# Patient Record
Sex: Female | Born: 1965 | Race: White | Hispanic: No | Smoking: Former smoker
Health system: Southern US, Community
[De-identification: ages and names within clinical notes are randomized; demographics above are authoritative.]

## PROBLEM LIST (undated history)

## (undated) DIAGNOSIS — K635 Polyp of colon: Secondary | ICD-10-CM

## (undated) DIAGNOSIS — Z973 Presence of spectacles and contact lenses: Secondary | ICD-10-CM

## (undated) DIAGNOSIS — F419 Anxiety disorder, unspecified: Secondary | ICD-10-CM

## (undated) DIAGNOSIS — M8580 Other specified disorders of bone density and structure, unspecified site: Secondary | ICD-10-CM

## (undated) DIAGNOSIS — Z972 Presence of dental prosthetic device (complete) (partial): Secondary | ICD-10-CM

## (undated) DIAGNOSIS — R7303 Prediabetes: Secondary | ICD-10-CM

## (undated) DIAGNOSIS — R0789 Other chest pain: Secondary | ICD-10-CM

## (undated) HISTORY — PX: EYE SURGERY: SHX253

## (undated) HISTORY — PX: TUBAL LIGATION: SHX77

## (undated) HISTORY — DX: Other specified disorders of bone density and structure, unspecified site: M85.80

## (undated) HISTORY — PX: ABDOMINAL HYSTERECTOMY: SHX81

## (undated) HISTORY — PX: CHOLECYSTECTOMY: SHX55

---
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
## 1998-03-10 HISTORY — PX: CHOLECYSTECTOMY: SHX55

## 2006-03-10 HISTORY — PX: ABDOMINAL HYSTERECTOMY: SHX81

## 2006-03-22 ENCOUNTER — Emergency Department: Payer: Self-pay | Admitting: Emergency Medicine

## 2006-03-22 ENCOUNTER — Other Ambulatory Visit: Payer: Self-pay

## 2007-04-07 ENCOUNTER — Ambulatory Visit: Payer: Self-pay | Admitting: Family Medicine

## 2007-07-13 ENCOUNTER — Emergency Department: Payer: Self-pay | Admitting: Emergency Medicine

## 2007-08-15 ENCOUNTER — Emergency Department: Payer: Self-pay | Admitting: Emergency Medicine

## 2008-07-06 ENCOUNTER — Ambulatory Visit: Payer: Self-pay | Admitting: Internal Medicine

## 2008-07-18 ENCOUNTER — Ambulatory Visit: Payer: Self-pay | Admitting: Internal Medicine

## 2008-07-20 ENCOUNTER — Ambulatory Visit: Payer: Self-pay | Admitting: Unknown Physician Specialty

## 2008-07-27 ENCOUNTER — Ambulatory Visit: Payer: Self-pay | Admitting: Obstetrics and Gynecology

## 2008-08-03 ENCOUNTER — Inpatient Hospital Stay: Payer: Self-pay | Admitting: Obstetrics & Gynecology

## 2009-02-02 ENCOUNTER — Emergency Department: Payer: Self-pay | Admitting: Emergency Medicine

## 2010-06-02 ENCOUNTER — Emergency Department: Payer: Self-pay | Admitting: Emergency Medicine

## 2010-12-08 ENCOUNTER — Inpatient Hospital Stay: Payer: Self-pay | Admitting: Internal Medicine

## 2011-01-07 ENCOUNTER — Emergency Department: Payer: Self-pay | Admitting: Emergency Medicine

## 2013-11-21 ENCOUNTER — Emergency Department: Payer: Self-pay | Admitting: Emergency Medicine

## 2013-11-21 LAB — URINALYSIS, COMPLETE
Bilirubin,UR: NEGATIVE
Glucose,UR: NEGATIVE mg/dL (ref 0–75)
KETONE: NEGATIVE
Leukocyte Esterase: NEGATIVE
Nitrite: NEGATIVE
Ph: 6 (ref 4.5–8.0)
Protein: NEGATIVE
RBC,UR: 13 /HPF (ref 0–5)
Specific Gravity: 1.018 (ref 1.003–1.030)
Squamous Epithelial: 9
WBC UR: 2 /HPF (ref 0–5)

## 2013-11-21 LAB — CBC WITH DIFFERENTIAL/PLATELET
Basophil #: 0.1 10*3/uL (ref 0.0–0.1)
Basophil %: 0.7 %
EOS ABS: 0.3 10*3/uL (ref 0.0–0.7)
EOS PCT: 2.5 %
HCT: 42.2 % (ref 35.0–47.0)
HGB: 14.3 g/dL (ref 12.0–16.0)
LYMPHS PCT: 26.1 %
Lymphocyte #: 3.1 10*3/uL (ref 1.0–3.6)
MCH: 30.9 pg (ref 26.0–34.0)
MCHC: 33.9 g/dL (ref 32.0–36.0)
MCV: 91 fL (ref 80–100)
MONOS PCT: 6.5 %
Monocyte #: 0.8 x10 3/mm (ref 0.2–0.9)
NEUTROS ABS: 7.6 10*3/uL — AB (ref 1.4–6.5)
Neutrophil %: 64.2 %
PLATELETS: 252 10*3/uL (ref 150–440)
RBC: 4.63 10*6/uL (ref 3.80–5.20)
RDW: 13.9 % (ref 11.5–14.5)
WBC: 11.8 10*3/uL — ABNORMAL HIGH (ref 3.6–11.0)

## 2013-11-21 LAB — COMPREHENSIVE METABOLIC PANEL
ALBUMIN: 3.7 g/dL (ref 3.4–5.0)
ANION GAP: 5 — AB (ref 7–16)
Alkaline Phosphatase: 65 U/L
BUN: 10 mg/dL (ref 7–18)
Bilirubin,Total: 0.3 mg/dL (ref 0.2–1.0)
CHLORIDE: 109 mmol/L — AB (ref 98–107)
CO2: 26 mmol/L (ref 21–32)
CREATININE: 1 mg/dL (ref 0.60–1.30)
Calcium, Total: 8.1 mg/dL — ABNORMAL LOW (ref 8.5–10.1)
Glucose: 131 mg/dL — ABNORMAL HIGH (ref 65–99)
OSMOLALITY: 280 (ref 275–301)
Potassium: 3.5 mmol/L (ref 3.5–5.1)
SGOT(AST): 19 U/L (ref 15–37)
SGPT (ALT): 24 U/L
Sodium: 140 mmol/L (ref 136–145)
Total Protein: 7.3 g/dL (ref 6.4–8.2)

## 2013-11-21 LAB — LIPASE, BLOOD: Lipase: 137 U/L (ref 73–393)

## 2014-02-21 ENCOUNTER — Emergency Department: Payer: Self-pay | Admitting: Emergency Medicine

## 2014-02-21 LAB — CBC WITH DIFFERENTIAL/PLATELET
BASOS ABS: 0.1 10*3/uL (ref 0.0–0.1)
Basophil %: 0.6 %
Eosinophil #: 0.2 10*3/uL (ref 0.0–0.7)
Eosinophil %: 2.1 %
HCT: 43.7 % (ref 35.0–47.0)
HGB: 14.6 g/dL (ref 12.0–16.0)
LYMPHS ABS: 2.2 10*3/uL (ref 1.0–3.6)
LYMPHS PCT: 22.9 %
MCH: 30.3 pg (ref 26.0–34.0)
MCHC: 33.3 g/dL (ref 32.0–36.0)
MCV: 91 fL (ref 80–100)
Monocyte #: 0.6 x10 3/mm (ref 0.2–0.9)
Monocyte %: 6.3 %
NEUTROS ABS: 6.4 10*3/uL (ref 1.4–6.5)
NEUTROS PCT: 68.1 %
PLATELETS: 215 10*3/uL (ref 150–440)
RBC: 4.8 10*6/uL (ref 3.80–5.20)
RDW: 13.1 % (ref 11.5–14.5)
WBC: 9.4 10*3/uL (ref 3.6–11.0)

## 2014-02-21 LAB — BASIC METABOLIC PANEL
Anion Gap: 8 (ref 7–16)
BUN: 10 mg/dL (ref 7–18)
CHLORIDE: 108 mmol/L — AB (ref 98–107)
CREATININE: 0.99 mg/dL (ref 0.60–1.30)
Calcium, Total: 8.9 mg/dL (ref 8.5–10.1)
Co2: 23 mmol/L (ref 21–32)
EGFR (Non-African Amer.): >60
GLUCOSE: 106 mg/dL — AB (ref 65–99)
Osmolality: 277 (ref 275–301)
POTASSIUM: 3.7 mmol/L (ref 3.5–5.1)
Sodium: 139 mmol/L (ref 136–145)

## 2014-02-21 LAB — URINALYSIS, COMPLETE
BACTERIA: NONE SEEN
Bilirubin,UR: NEGATIVE
Glucose,UR: NEGATIVE mg/dL (ref 0–75)
Ketone: NEGATIVE
Leukocyte Esterase: NEGATIVE
NITRITE: NEGATIVE
PROTEIN: NEGATIVE
Ph: 6 (ref 4.5–8.0)
Specific Gravity: 1.003 (ref 1.003–1.030)
Squamous Epithelial: 1
WBC UR: NONE SEEN /HPF (ref 0–5)

## 2014-02-21 LAB — TROPONIN I: Troponin-I: <0.02 ng/mL

## 2014-04-10 DIAGNOSIS — D242 Benign neoplasm of left breast: Secondary | ICD-10-CM

## 2014-04-10 HISTORY — DX: Benign neoplasm of left breast: D24.2

## 2014-05-04 HISTORY — PX: BREAST EXCISIONAL BIOPSY: SUR124

## 2014-05-12 DIAGNOSIS — D242 Benign neoplasm of left breast: Secondary | ICD-10-CM | POA: Insufficient documentation

## 2014-11-20 DIAGNOSIS — D249 Benign neoplasm of unspecified breast: Secondary | ICD-10-CM | POA: Insufficient documentation

## 2014-12-15 ENCOUNTER — Emergency Department
Admission: EM | Admit: 2014-12-15 | Discharge: 2014-12-15 | Disposition: A | Discharge status: Home / Self Care | Payer: Managed Care, Other (non HMO) | Attending: Emergency Medicine | Admitting: Emergency Medicine

## 2014-12-15 ENCOUNTER — Emergency Department: Payer: Managed Care, Other (non HMO)

## 2014-12-15 ENCOUNTER — Encounter: Payer: Self-pay | Admitting: Emergency Medicine

## 2014-12-15 DIAGNOSIS — R109 Unspecified abdominal pain: Secondary | ICD-10-CM

## 2014-12-15 DIAGNOSIS — R319 Hematuria, unspecified: Secondary | ICD-10-CM

## 2014-12-15 DIAGNOSIS — N390 Urinary tract infection, site not specified: Secondary | ICD-10-CM | POA: Insufficient documentation

## 2014-12-15 DIAGNOSIS — Z720 Tobacco use: Secondary | ICD-10-CM | POA: Diagnosis not present

## 2014-12-15 DIAGNOSIS — R1031 Right lower quadrant pain: Secondary | ICD-10-CM | POA: Diagnosis present

## 2014-12-15 LAB — URINALYSIS COMPLETE WITH MICROSCOPIC (ARMC ONLY)
Bilirubin Urine: NEGATIVE
GLUCOSE, UA: NEGATIVE mg/dL
KETONES UR: NEGATIVE mg/dL
Leukocytes, UA: NEGATIVE
NITRITE: NEGATIVE
Protein, ur: NEGATIVE mg/dL
SPECIFIC GRAVITY, URINE: 1.006 (ref 1.005–1.030)
pH: 7 (ref 5.0–8.0)

## 2014-12-15 LAB — COMPREHENSIVE METABOLIC PANEL
ALBUMIN: 4.4 g/dL (ref 3.5–5.0)
ALK PHOS: 55 U/L (ref 38–126)
ALT: 17 U/L (ref 14–54)
AST: 25 U/L (ref 15–41)
Anion gap: 9 (ref 5–15)
BILIRUBIN TOTAL: 0.5 mg/dL (ref 0.3–1.2)
BUN: 9 mg/dL (ref 6–20)
CALCIUM: 9.4 mg/dL (ref 8.9–10.3)
CO2: 21 mmol/L — ABNORMAL LOW (ref 22–32)
Chloride: 109 mmol/L (ref 101–111)
Creatinine, Ser: 0.88 mg/dL (ref 0.44–1.00)
GFR calc Af Amer: >60 mL/min (ref >60)
GFR calc non Af Amer: >60 mL/min (ref >60)
GLUCOSE: 101 mg/dL — AB (ref 65–99)
Potassium: 4.1 mmol/L (ref 3.5–5.1)
Sodium: 139 mmol/L (ref 135–145)
TOTAL PROTEIN: 7.5 g/dL (ref 6.5–8.1)

## 2014-12-15 LAB — CBC
HCT: 42.2 % (ref 35.0–47.0)
Hemoglobin: 14.9 g/dL (ref 12.0–16.0)
MCH: 30.9 pg (ref 26.0–34.0)
MCHC: 35.2 g/dL (ref 32.0–36.0)
MCV: 87.7 fL (ref 80.0–100.0)
Platelets: 229 10*3/uL (ref 150–440)
RBC: 4.81 MIL/uL (ref 3.80–5.20)
RDW: 13.7 % (ref 11.5–14.5)
WBC: 12.4 10*3/uL — ABNORMAL HIGH (ref 3.6–11.0)

## 2014-12-15 LAB — LIPASE, BLOOD: Lipase: 29 U/L (ref 22–51)

## 2014-12-15 MED ORDER — FENTANYL CITRATE (PF) 100 MCG/2ML IJ SOLN
75.0000 ug | Freq: Once | INTRAMUSCULAR | Status: AC
  Administered 2014-12-15: 75 ug via INTRAVENOUS

## 2014-12-15 MED ORDER — IOHEXOL 240 MG/ML SOLN
25.0000 mL | Freq: Once | INTRAMUSCULAR | Status: AC | PRN
  Administered 2014-12-15: 25 mL via ORAL

## 2014-12-15 MED ORDER — ONDANSETRON HCL 4 MG/2ML IJ SOLN
INTRAMUSCULAR | Status: AC
  Administered 2014-12-15: 4 mg via INTRAVENOUS
  Filled 2014-12-15: qty 2

## 2014-12-15 MED ORDER — KETOROLAC TROMETHAMINE 30 MG/ML IJ SOLN: 30.0000 mg | Freq: Once | INTRAMUSCULAR | Status: DC

## 2014-12-15 MED ORDER — SODIUM CHLORIDE 0.9 % IV BOLUS (SEPSIS)
1000.0000 mL | Freq: Once | INTRAVENOUS | Status: AC
  Administered 2014-12-15: 1000 mL via INTRAVENOUS

## 2014-12-15 MED ORDER — CEPHALEXIN 500 MG PO CAPS: 500.0000 mg | ORAL_CAPSULE | Freq: Four times a day (QID) | ORAL | Status: DC

## 2014-12-15 MED ORDER — ONDANSETRON 4 MG PO TBDP: 4.0000 mg | ORAL_TABLET | Freq: Four times a day (QID) | ORAL | Status: DC | PRN

## 2014-12-15 MED ORDER — FENTANYL CITRATE (PF) 100 MCG/2ML IJ SOLN
INTRAMUSCULAR | Status: AC
  Administered 2014-12-15: 75 ug via INTRAVENOUS
  Filled 2014-12-15: qty 2

## 2014-12-15 MED ORDER — IOHEXOL 300 MG/ML  SOLN
100.0000 mL | Freq: Once | INTRAMUSCULAR | Status: AC | PRN
  Administered 2014-12-15: 100 mL via INTRAVENOUS

## 2014-12-15 MED ORDER — HYDROCODONE-ACETAMINOPHEN 5-325 MG PO TABS: 1.0000 | ORAL_TABLET | Freq: Four times a day (QID) | ORAL | Status: DC | PRN

## 2014-12-15 MED ORDER — ONDANSETRON HCL 4 MG/2ML IJ SOLN
4.0000 mg | Freq: Once | INTRAMUSCULAR | Status: AC
  Administered 2014-12-15: 4 mg via INTRAVENOUS

## 2014-12-15 NOTE — ED Notes (Signed)
Pt to ed with c/o right lower intermittent abd pain acute onset last night +nausea.  Pt denies urinary problems or pain.

## 2014-12-15 NOTE — ED Notes (Signed)
Patient transported to CT 

## 2014-12-15 NOTE — ED Notes (Signed)
Pt states abd pain at her belly button last night, increasing in pain now radiates to her right abd, pt states pain comes and goes, states nausea

## 2014-12-15 NOTE — Discharge Instructions (Signed)
Abdominal Pain, Adult  Please follow-up with your doctor this week. I suspect he may have a small kidney stone or slight urinary tract infection causing your bladder to spasm. However, should you develop a fever, vomiting, severe pain, black or bloody stool, or other new concerns arise please return to the emergency room.  Many things can cause abdominal pain. Usually, abdominal pain is not caused by a disease and will improve without treatment. It can often be observed and treated at home. Your health care provider will do a physical exam and possibly order blood tests and X-rays to help determine the seriousness of your pain. However, in many cases, more time must pass before a clear cause of the pain can be found. Before that point, your health care provider may not know if you need more testing or further treatment. HOME CARE INSTRUCTIONS Monitor your abdominal pain for any changes. The following actions may help to alleviate any discomfort you are experiencing:  Only take over-the-counter or prescription medicines as directed by your health care provider.  Do not take laxatives unless directed to do so by your health care provider.  Try a clear liquid diet (broth, tea, or water) as directed by your health care provider. Slowly move to a bland diet as tolerated. SEEK MEDICAL CARE IF:  You have unexplained abdominal pain.  You have abdominal pain associated with nausea or diarrhea.  You have pain when you urinate or have a bowel movement.  You experience abdominal pain that wakes you in the night.  You have abdominal pain that is worsened or improved by eating food.  You have abdominal pain that is worsened with eating fatty foods.  You have a fever. SEEK IMMEDIATE MEDICAL CARE IF:  Your pain does not go away within 2 hours.  You keep throwing up (vomiting).  Your pain is felt only in portions of the abdomen, such as the right side or the left lower portion of the abdomen.  You  pass bloody or black tarry stools. MAKE SURE YOU:  Understand these instructions.  Will watch your condition.  Will get help right away if you are not doing well or get worse.   This information is not intended to replace advice given to you by your health care provider. Make sure you discuss any questions you have with your health care provider.   Document Released: 12/04/2004 Document Revised: 11/15/2014 Document Reviewed: 11/03/2012 Elsevier Interactive Patient Education [employer].

## 2014-12-15 NOTE — ED Notes (Signed)
Dr. Quale at bedside.  

## 2014-12-15 NOTE — ED Provider Notes (Addendum)
[facility] Emergency Department Provider Note REMINDER - THIS NOTE IS NOT A FINAL MEDICAL RECORD UNTIL IT IS SIGNED. UNTIL THEN, THE CONTENT BELOW MAY REFLECT INFORMATION FROM A DOCUMENTATION TEMPLATE, NOT THE ACTUAL PATIENT VISIT. ____________________________________________  Time seen: Approximately 12:02 PM  I have reviewed the triage vital signs and the nursing notes.   HISTORY  Chief Complaint Abdominal Pain    HPI Nichole Hall is a 49 y.o. female reports a previous history of kidney stones, cholecystectomy, and hysterectomy.  Patient notes that she started having discomfort around her bellybutton last night, and she is now had increasing pain she describes is severe and cramping in the right lower abdomen. This is associated with nausea but no vomiting. No fevers or chills. No vaginal bleeding. No chest pain or trouble breathing.  She states she has a 10 out of 10 pain located in the right lower abdomen that shoots to the right back.   History reviewed. No pertinent past medical history.  There are no active problems to display for this patient.   History reviewed. No pertinent past surgical history.  Current Outpatient Rx  Name  Route  Sig  Dispense  Refill  . cephALEXin (KEFLEX) 500 MG capsule   Oral   Take 1 capsule (500 mg total) by mouth 4 (four) times daily.   28 capsule   0   . HYDROcodone-acetaminophen (NORCO/VICODIN) 5-325 MG tablet   Oral   Take 1 tablet by mouth every 6 (six) hours as needed for moderate pain.   15 tablet   0   . ondansetron (ZOFRAN ODT) 4 MG disintegrating tablet   Oral   Take 1 tablet (4 mg total) by mouth every 6 (six) hours as needed for nausea or vomiting.   20 tablet   0     Allergies Review of patient's allergies indicates no known allergies.  History reviewed. No pertinent family history.  Social History Social History  Substance Use Topics  . Smoking status: Current Every Day Smoker  .  Smokeless tobacco: None  . Alcohol Use: No    Review of Systems Constitutional: No fever/chills Eyes: No visual changes. ENT: No sore throat. Cardiovascular: Denies chest pain. Respiratory: Denies shortness of breath. Gastrointestinal: Passing gas normally. No diarrhea.  No constipation. Genitourinary: Negative for dysuria. No pain with urination. Musculoskeletal: Negative for back pain. Skin: Negative for rash. Neurological: Negative for headaches, focal weakness or numbness.  10-point ROS otherwise negative.  ____________________________________________   PHYSICAL EXAM:  VITAL SIGNS: ED Triage Vitals  Enc Vitals Group     BP 12/15/14 1047 124/79 mmHg     Pulse Rate 12/15/14 1047 80     Resp 12/15/14 1047 16     Temp 12/15/14 1047 98.2 F (36.8 C)     Temp Source 12/15/14 1047 Oral     SpO2 12/15/14 1047 99 %     Weight 12/15/14 1047 175 lb (79.379 kg)     Height 12/15/14 1047 5\' 9"  (1.753 m)     Head Cir --      Peak Flow --      Pain Score 12/15/14 1053 7     Pain Loc --      Pain Edu? --      Excl. in [place]? --    Constitutional: Alert and oriented. Well appearing except sitting up in the bed, holding her hand on her right lower abdomen in severe pain. Eyes: Conjunctivae are normal. PERRL. EOMI. Head: Atraumatic. Nose:  No congestion/rhinnorhea. Mouth/Throat: Mucous membranes are moist.  Oropharynx non-erythematous. Neck: No stridor.   Cardiovascular: Normal rate, regular rhythm. Grossly normal heart sounds.  Good peripheral circulation. Respiratory: Normal respiratory effort.  No retractions. Lungs CTAB. Gastrointestinal: Soft and nontender except for focal tenderness in the right lower quadrant without guarding, the patient does exhibit some mild rebound tenderness. No distention. No abdominal bruits. No CVA tenderness. Musculoskeletal: No lower extremity tenderness nor edema.  No joint effusions. Neurologic:  Normal speech and language. No gross focal neurologic  deficits are appreciated. No gait instability. Skin:  Skin is warm, dry and intact. No rash noted. Psychiatric: Mood and affect are normal. Speech and behavior are normal.  ____________________________________________   LABS (all labs ordered are listed, but only abnormal results are displayed)  Labs Reviewed  COMPREHENSIVE METABOLIC PANEL - Abnormal; Notable for the following:    CO2 21 (*)    Glucose, Bld 101 (*)    All other components within normal limits  CBC - Abnormal; Notable for the following:    WBC 12.4 (*)    All other components within normal limits  URINALYSIS COMPLETEWITH MICROSCOPIC ([facility] ONLY) - Abnormal; Notable for the following:    Color, Urine YELLOW (*)    APPearance HAZY (*)    Hgb urine dipstick 1+ (*)    Bacteria, UA MANY (*)    Squamous Epithelial / LPF 6-30 (*)    All other components within normal limits  LIPASE, BLOOD   ____________________________________________  EKG   ____________________________________________  RADIOLOGY  CT Abdomen Pelvis W Contrast (Final result) Result time: 12/15/14 13:36:23   Final result by Rad Results In Interface (12/15/14 13:36:23)   Narrative:   CLINICAL DATA: Right lower quadrant pain for 1 day  EXAM: CT ABDOMEN AND PELVIS WITH CONTRAST  TECHNIQUE: Multidetector CT imaging of the abdomen and pelvis was performed using the standard protocol following bolus administration of intravenous contrast.  CONTRAST: 118mL OMNIPAQUE IOHEXOL 300 MG/ML SOLN  COMPARISON: 11/21/2013  FINDINGS: Stable left lower lobe pulmonary nodule. Dependent atelectasis  Fatty liver  Postcholecystectomy  Spleen, pancreas, adrenal glands are within normal limits  Stable tiny hypodensity in the left kidney. Right kidney is unremarkable.  Normal appendix. No evidence of sigmoid diverticulitis.  2.7 cm cyst in the right ovary is smaller. New 3.6 cm hypodensity in the left ovary.  Small amount of free fluid.  Bladder is unremarkable. No abnormal retroperitoneal adenopathy.  IMPRESSION: Normal appendix  New 3.6 cm lesion in the left ovary. Followup ultrasound in 6 weeks is recommended to ensure resolution.     ____________________________________________   PROCEDURES  Procedure(s) performed: None  Critical Care performed: No  ____________________________________________   INITIAL IMPRESSION / ASSESSMENT AND PLAN / ED COURSE  Pertinent labs & imaging results that were available during my care of the patient were reviewed by me and considered in my medical decision making (see chart for details).  Patient presents with periumbilical pain that is out of the right lower quadrant. She appears to be in severe pain rated she does report a morphine drops her blood pressures, but is able to tolerate other medications. No history of allergy.  We'll give the patient medication for pain, based on the focality of her right lower quadrant pain I wished rule out acute appendicitis, other considerations would be ovarian process or abscess as the patient reports she does have a history of a previous infection after a hysterectomy 5 years ago. She is unsure if she still has  both ovaries. Kidney stone is also strongly considered. No cardiac or pulmonary symptoms.  We'll obtain abdominal labs comforted CT imaging. No cardiopulmonary symptoms.  ----------------------------------------- 3:33 PM on 12/15/2014 -----------------------------------------  Patient reports improvement. In review of her history and presentation, I would suspect this may be a slight urinary tract infection or she may be passing a small kidney stone which would explain her pain given her radius history. Her appendix is normal and there is no intra-abdominal abnormality noted on CT that would explain her discomfort. She does appear much improved.  I discuss careful return precautions and also advised that she should follow-up in  the next month with her OB/GYN physician regarding her lesion on the left ovary which she agrres to.  I will prescribe the patient a narcotic pain medicine due to their condition which I anticipate will cause at least moderate pain short term. I discussed with the patient safe use of narcotic pain medicines, and that they are not to drive, work in dangerous areas, or ever take more than prescribed (no more than 1 pill every 6 hours). We discussed that this is the type of medication that "Alfonse Spruce" may have overdosed on and the risks of this type of medicine. Patient is very agreeable to only use as prescribed and to never use more than prescribed. ____________________________________________   FINAL CLINICAL IMPRESSION(S) / ED DIAGNOSES  Final diagnoses:  Acute abdominal pain in right flank  Acute urinary tract infection  Hematuria      Delman Kitten, MD 12/15/14 [place], MD 12/15/14 1536

## 2015-01-27 ENCOUNTER — Encounter: Payer: Self-pay | Admitting: Emergency Medicine

## 2015-01-27 ENCOUNTER — Emergency Department: Payer: Managed Care, Other (non HMO)

## 2015-01-27 ENCOUNTER — Emergency Department
Admission: EM | Admit: 2015-01-27 | Discharge: 2015-01-27 | Discharge status: Against Medical Advice | Payer: Managed Care, Other (non HMO) | Attending: Emergency Medicine | Admitting: Emergency Medicine

## 2015-01-27 DIAGNOSIS — F17200 Nicotine dependence, unspecified, uncomplicated: Secondary | ICD-10-CM | POA: Insufficient documentation

## 2015-01-27 DIAGNOSIS — M549 Dorsalgia, unspecified: Secondary | ICD-10-CM | POA: Diagnosis not present

## 2015-01-27 DIAGNOSIS — R42 Dizziness and giddiness: Secondary | ICD-10-CM | POA: Insufficient documentation

## 2015-01-27 DIAGNOSIS — R0602 Shortness of breath: Secondary | ICD-10-CM | POA: Diagnosis not present

## 2015-01-27 DIAGNOSIS — R079 Chest pain, unspecified: Secondary | ICD-10-CM | POA: Diagnosis not present

## 2015-01-27 DIAGNOSIS — R110 Nausea: Secondary | ICD-10-CM | POA: Insufficient documentation

## 2015-01-27 DIAGNOSIS — Z792 Long term (current) use of antibiotics: Secondary | ICD-10-CM | POA: Insufficient documentation

## 2015-01-27 HISTORY — DX: Anxiety disorder, unspecified: F41.9

## 2015-01-27 LAB — BASIC METABOLIC PANEL
ANION GAP: 8 (ref 5–15)
BUN: 12 mg/dL (ref 6–20)
CALCIUM: 9.9 mg/dL (ref 8.9–10.3)
CO2: 29 mmol/L (ref 22–32)
Chloride: 104 mmol/L (ref 101–111)
Creatinine, Ser: 0.86 mg/dL (ref 0.44–1.00)
Glucose, Bld: 109 mg/dL — ABNORMAL HIGH (ref 65–99)
Potassium: 4.5 mmol/L (ref 3.5–5.1)
SODIUM: 141 mmol/L (ref 135–145)

## 2015-01-27 LAB — CBC
HCT: 45.7 % (ref 35.0–47.0)
Hemoglobin: 15.1 g/dL (ref 12.0–16.0)
MCH: 29.4 pg (ref 26.0–34.0)
MCHC: 33 g/dL (ref 32.0–36.0)
MCV: 89 fL (ref 80.0–100.0)
Platelets: 236 10*3/uL (ref 150–440)
RBC: 5.14 MIL/uL (ref 3.80–5.20)
RDW: 13.7 % (ref 11.5–14.5)
WBC: 9.1 10*3/uL (ref 3.6–11.0)

## 2015-01-27 LAB — TROPONIN I

## 2015-01-27 MED ORDER — MECLIZINE HCL 25 MG PO TABS
25.0000 mg | ORAL_TABLET | Freq: Once | ORAL | Status: DC
  Filled 2015-01-27: qty 1

## 2015-01-27 NOTE — ED Notes (Signed)
Pt not in room for medication

## 2015-01-27 NOTE — ED Provider Notes (Signed)
[facility] Emergency Department Provider Note    ____________________________________________  Time seen: 1400  I have reviewed the triage vital signs and the nursing notes.   HISTORY  Chief Complaint Chest Pain   History limited by: Not Limited   HPI Nichole Hall is a 49 y.o. female who presents to the emergency department today with complaint of not feeling well. The patient states she has a slightly hard time explaining exactly what that means. She does state however she has been feeling lightheaded she said chest pain that she has had back pain she has had some nausea. She states the symptoms have been going on for 4 days. They are intermittent. She denies doing anything different or unusual on Wednesday when they started. She denies any fevers with this. She states she did have similar symptoms number of years ago and thinks that she was told she had pleurisy.   Past Medical History  Diagnosis Date  . Anxiety     There are no active problems to display for this patient.   History reviewed. No pertinent past surgical history.  Current Outpatient Rx  Name  Route  Sig  Dispense  Refill  . cephALEXin (KEFLEX) 500 MG capsule   Oral   Take 1 capsule (500 mg total) by mouth 4 (four) times daily.   28 capsule   0   . HYDROcodone-acetaminophen (NORCO/VICODIN) 5-325 MG tablet   Oral   Take 1 tablet by mouth every 6 (six) hours as needed for moderate pain.   15 tablet   0   . ondansetron (ZOFRAN ODT) 4 MG disintegrating tablet   Oral   Take 1 tablet (4 mg total) by mouth every 6 (six) hours as needed for nausea or vomiting.   20 tablet   0     Allergies Morphine and related  History reviewed. No pertinent family history.  Social History Social History  Substance Use Topics  . Smoking status: Current Every Day Smoker  . Smokeless tobacco: None  . Alcohol Use: No    Review of Systems  Constitutional: Negative for  fever. Cardiovascular: Positive for chest pain. Respiratory: Positive for shortness of breath. Gastrointestinal: Negative for abdominal pain, vomiting and diarrhea. Genitourinary: Negative for dysuria. Musculoskeletal: Negative for back pain. Skin: Negative for rash. Neurological: Negative for headaches, focal weakness or numbness. 10-point ROS otherwise negative.  ____________________________________________   PHYSICAL EXAM:  VITAL SIGNS: ED Triage Vitals  Enc Vitals Group     BP 01/27/15 1037 105/80 mmHg     Pulse Rate 01/27/15 1037 76     Resp 01/27/15 1037 20     Temp 01/27/15 1037 98 F (36.7 C)     Temp Source 01/27/15 1037 Oral     SpO2 01/27/15 1037 100 %     Weight 01/27/15 1037 175 lb (79.379 kg)     Height --      Head Cir --      Peak Flow --      Pain Score 01/27/15 1034 6   Constitutional: Alert and oriented. Well appearing and in no distress. Eyes: Conjunctivae are normal. PERRL. Normal extraocular movements. ENT   Head: Normocephalic and atraumatic.   Nose: No congestion/rhinnorhea.   Mouth/Throat: Mucous membranes are moist.   Neck: No stridor. Hematological/Lymphatic/Immunilogical: No cervical lymphadenopathy. Cardiovascular: Normal rate, regular rhythm.  No murmurs, rubs, or gallops. Respiratory: Normal respiratory effort without tachypnea nor retractions. Breath sounds are clear and equal bilaterally. No wheezes/rales/rhonchi. Gastrointestinal: Soft and  nontender. No distention.  Genitourinary: Deferred Musculoskeletal: Normal range of motion in all extremities. No joint effusions.  No lower extremity tenderness nor edema. Neurologic:  Normal speech and language. No gross focal neurologic deficits are appreciated.  Skin:  Skin is warm, dry and intact. No rash noted. Psychiatric: Mood and affect are normal. Speech and behavior are normal. Patient exhibits appropriate insight and judgment.  ____________________________________________     LABS (pertinent positives/negatives)  Labs Reviewed  BASIC METABOLIC PANEL - Abnormal; Notable for the following:    Glucose, Bld 109 (*)    All other components within normal limits  TROPONIN I  CBC     ____________________________________________   EKG  I, Nance Pear, attending physician, personally viewed and interpreted this EKG  EKG Time: 1036 Rate: 77 Rhythm: normal sinus rhythm Axis: normal Intervals: qtc 439 QRS: narrow ST changes: no st elevation, t wave inversion V1,V2,V3 Impression: abnormal ekg  ____________________________________________    RADIOLOGY  CXR  IMPRESSION: No active cardiopulmonary disease.  I, Jadelynn Boylan, personally viewed and evaluated these images (plain radiographs) as part of my medical decision making. ____________________________________________   PROCEDURES  Procedure(s) performed: None  Critical Care performed: No  ____________________________________________   INITIAL IMPRESSION / ASSESSMENT AND PLAN / ED COURSE  Pertinent labs & imaging results that were available during my care of the patient were reviewed by me and considered in my medical decision making (see chart for details).  Patient presented to the emergency room for multiple complaints today. Blood work without any concerning findings. On my exam patient appeared in no acute distress. No focal neuro deficits. I did want to try meclizine to see if patient felt better. I wanted to reassess how the patient did after the medication to see if any further testing needed to be done. It appears however that the patient eloped after my examination prior to getting the medication.  ____________________________________________   FINAL CLINICAL IMPRESSION(S) / ED DIAGNOSES  Final diagnoses:  Dizzy     Nance Pear, MD 01/27/15 [phone]

## 2015-01-27 NOTE — ED Notes (Addendum)
States feeling lightheaded, chest pain, back pain x 4 days - thought was stomach so took antacids, also took her last anxiety meds. States feels "swimmy headed" and weak and the chest pain on and off. States feel like can't get breath - smokes 2-3 cigarettes daily and is in process of quiting.

## 2015-01-27 NOTE — ED Notes (Signed)
Pt to ed with c/o chest pain that started about 2 days ago.  Pt states pain is sharp and feels like pressure.  Pt reports sob, weakness, dizziness, diaphoresis, nausea. Pt states she tried to take anxiety meds but no relief.  Pt also reports pain radiates into left shoulder and back and neck.

## 2015-06-14 ENCOUNTER — Ambulatory Visit
Admission: RE | Admit: 2015-06-14 | Discharge: 2015-06-14 | Disposition: A | Discharge status: Home / Self Care | Payer: Managed Care, Other (non HMO) | Source: Ambulatory Visit | Attending: Internal Medicine | Admitting: Internal Medicine

## 2015-06-14 ENCOUNTER — Other Ambulatory Visit: Payer: Self-pay | Admitting: Internal Medicine

## 2015-06-14 DIAGNOSIS — M50322 Other cervical disc degeneration at C5-C6 level: Secondary | ICD-10-CM | POA: Insufficient documentation

## 2015-06-14 DIAGNOSIS — R52 Pain, unspecified: Secondary | ICD-10-CM

## 2015-06-14 DIAGNOSIS — M542 Cervicalgia: Secondary | ICD-10-CM | POA: Diagnosis present

## 2015-06-14 DIAGNOSIS — R200 Anesthesia of skin: Secondary | ICD-10-CM | POA: Diagnosis present

## 2015-09-26 ENCOUNTER — Emergency Department: Payer: Managed Care, Other (non HMO)

## 2015-09-26 ENCOUNTER — Emergency Department
Admission: EM | Admit: 2015-09-26 | Discharge: 2015-09-26 | Disposition: A | Discharge status: Home / Self Care | Payer: Managed Care, Other (non HMO) | Attending: Emergency Medicine | Admitting: Emergency Medicine

## 2015-09-26 DIAGNOSIS — L03115 Cellulitis of right lower limb: Secondary | ICD-10-CM | POA: Insufficient documentation

## 2015-09-26 DIAGNOSIS — M25571 Pain in right ankle and joints of right foot: Secondary | ICD-10-CM | POA: Diagnosis present

## 2015-09-26 DIAGNOSIS — N390 Urinary tract infection, site not specified: Secondary | ICD-10-CM | POA: Diagnosis not present

## 2015-09-26 DIAGNOSIS — F17200 Nicotine dependence, unspecified, uncomplicated: Secondary | ICD-10-CM | POA: Diagnosis not present

## 2015-09-26 LAB — URIC ACID: URIC ACID, SERUM: 4.8 mg/dL (ref 2.3–6.6)

## 2015-09-26 LAB — CBC WITH DIFFERENTIAL/PLATELET
BASOS PCT: 1 %
Basophils Absolute: 0.1 10*3/uL (ref 0–0.1)
EOS ABS: 0.3 10*3/uL (ref 0–0.7)
EOS PCT: 3 %
HCT: 42.6 % (ref 35.0–47.0)
Hemoglobin: 14.7 g/dL (ref 12.0–16.0)
LYMPHS ABS: 2.9 10*3/uL (ref 1.0–3.6)
Lymphocytes Relative: 28 %
MCH: 30.9 pg (ref 26.0–34.0)
MCHC: 34.5 g/dL (ref 32.0–36.0)
MCV: 89.4 fL (ref 80.0–100.0)
MONO ABS: 0.6 10*3/uL (ref 0.2–0.9)
MONOS PCT: 6 %
Neutro Abs: 6.5 10*3/uL (ref 1.4–6.5)
Neutrophils Relative %: 62 %
Platelets: 216 10*3/uL (ref 150–440)
RBC: 4.76 MIL/uL (ref 3.80–5.20)
RDW: 14 % (ref 11.5–14.5)
WBC: 10.4 10*3/uL (ref 3.6–11.0)

## 2015-09-26 LAB — URINALYSIS COMPLETE WITH MICROSCOPIC (ARMC ONLY)
Bacteria, UA: NONE SEEN
Bilirubin Urine: NEGATIVE
GLUCOSE, UA: NEGATIVE mg/dL
KETONES UR: NEGATIVE mg/dL
Nitrite: NEGATIVE
PROTEIN: NEGATIVE mg/dL
SPECIFIC GRAVITY, URINE: 1.008 (ref 1.005–1.030)
pH: 5 (ref 5.0–8.0)

## 2015-09-26 LAB — BASIC METABOLIC PANEL
Anion gap: 9 (ref 5–15)
BUN: 10 mg/dL (ref 6–20)
CALCIUM: 9.1 mg/dL (ref 8.9–10.3)
CHLORIDE: 106 mmol/L (ref 101–111)
CO2: 22 mmol/L (ref 22–32)
CREATININE: 0.81 mg/dL (ref 0.44–1.00)
GFR calc non Af Amer: >60 mL/min (ref >60)
GLUCOSE: 120 mg/dL — AB (ref 65–99)
Potassium: 3.6 mmol/L (ref 3.5–5.1)
Sodium: 137 mmol/L (ref 135–145)

## 2015-09-26 MED ORDER — IBUPROFEN 600 MG PO TABS: 600.0000 mg | ORAL_TABLET | Freq: Four times a day (QID) | ORAL | Status: DC | PRN

## 2015-09-26 MED ORDER — HYDROCODONE-ACETAMINOPHEN 5-325 MG PO TABS: 1.0000 | ORAL_TABLET | Freq: Four times a day (QID) | ORAL | Status: DC | PRN

## 2015-09-26 MED ORDER — CEPHALEXIN 500 MG PO CAPS: 500.0000 mg | ORAL_CAPSULE | Freq: Four times a day (QID) | ORAL | Status: AC

## 2015-09-26 NOTE — ED Notes (Signed)
Pt reports right ankle pain and swelling.

## 2015-09-26 NOTE — ED Provider Notes (Signed)
[facility] Emergency Department Provider Note  ____________________________________________  Time seen: Approximately 5:30 PM  I have reviewed the triage vital signs and the nursing notes.   HISTORY  Chief Complaint Ankle Pain    HPI Nichole Hall is a 50 y.o. female , NAD, presents to the emergency department with 1 week history of right ankle pain. Patient states she woke in the middle of the night approximately one week ago with right lateral ankle and lower leg pain. Has noticed redness and swelling to the outside portion of the ankle and lower leg over the last week that has been worsening. States pain increases with ambulation. He is uncertain of any injuries or traumas to the ankle or leg. Denies any pain about the right knee, hip, foot or toes. Has not seen any open wounds or other skin sores. Denies chest pain, shortness of breath, headaches, visual changes or loss. Has not had any numbness, weakness, tingling to the right lower extremity. Has taken Tylenol without any relief of pain. Denies fever, chills, body aches. Patient does also note that she has had some dysuria and vaginal itching over the last few days and would like to be checked for urinary tract infection. Denies back pain nor flank pain. No hematuria, vaginal discharge or pelvic pain.   Past Medical History  Diagnosis Date  . Anxiety     There are no active problems to display for this patient.   No past surgical history on file.  Current Outpatient Rx  Name  Route  Sig  Dispense  Refill  . cephALEXin (KEFLEX) 500 MG capsule   Oral   Take 1 capsule (500 mg total) by mouth 4 (four) times daily.   28 capsule   0   . HYDROcodone-acetaminophen (NORCO) 5-325 MG tablet   Oral   Take 1 tablet by mouth every 6 (six) hours as needed for severe pain.   6 tablet   0   . ibuprofen (ADVIL,MOTRIN) 600 MG tablet   Oral   Take 1 tablet (600 mg total) by mouth every 6 (six) hours as needed.    30 tablet   0   . ondansetron (ZOFRAN ODT) 4 MG disintegrating tablet   Oral   Take 1 tablet (4 mg total) by mouth every 6 (six) hours as needed for nausea or vomiting.   20 tablet   0     Allergies Morphine and related  No family history on file.  Social History Social History  Substance Use Topics  . Smoking status: Current Every Day Smoker  . Smokeless tobacco: Not on file  . Alcohol Use: No     Review of Systems  Constitutional: No fever/chills, Fatigue Eyes: No visual changes nor loss of vision Cardiovascular: No chest pain, Palpitations. Respiratory: No cough. No shortness of breath. No wheezing.  Gastrointestinal: No abdominal pain.  No nausea, vomiting.   Genitourinary: Positive for dysuria or vaginal itching. No hematuria, pelvic pain, vaginal discharge. No urinary hesitancy, urgency or increased frequency. Musculoskeletal: Positive right ankle pain. Negative for back or flank pain.  Skin: Positive redness, swelling right lower leg, ankle. Negative for rash. Neurological: Negative for headaches, focal weakness or numbness. No tingling. 10-point ROS otherwise negative.  ____________________________________________   PHYSICAL EXAM:  VITAL SIGNS: ED Triage Vitals  Enc Vitals Group     BP 09/26/15 1713 127/68 mmHg     Pulse Rate 09/26/15 1713 86     Resp 09/26/15 1713 20  Temp 09/26/15 1713 98.2 F (36.8 C)     Temp Source 09/26/15 1713 Oral     SpO2 09/26/15 1713 98 %     Weight 09/26/15 1713 176 lb (79.833 kg)     Height 09/26/15 1713 5\' 9"  (1.753 m)     Head Cir --      Peak Flow --      Pain Score 09/26/15 1713 6     Pain Loc --      Pain Edu? --      Excl. in [place]? --      Constitutional: Alert and oriented. Well appearing and in no acute distress. Eyes: Conjunctivae are normal.  Head: Atraumatic. Neck: Supple with full range of motion Hematological/Lymphatic/Immunilogical: No cervical lymphadenopathy. Cardiovascular: Normal rate,  regular rhythm. Normal S1 and S2.  Good peripheral circulation with 2+ pulses noted in the right lower extremity. Capillary refills brisk in all digits of the right foot. Respiratory: Normal respiratory effort without tachypnea or retractions. Lungs CTAB with breath sounds noted in all lung fields. Musculoskeletal: Full range of motion of all digits on the right foot. Full range of motion of the right ankle but increasing pain about the lateral lower leg and ankle with full flexion. No laxity with anterior or posterior drawer of the right ankle. No lower extremity edema.  No joint effusions. Neurologic:  Normal speech and language. No gross focal neurologic deficits are appreciated.  Skin:  8 cm x 4 cm oblong area of erythema and warmth about the skin of the right lateral lower leg and ankle. Area of erythema is significantly tender to palpation. No open wounds or skin sores noted. No induration or fluctuance to the area. Skin is warm, dry and intact. No rash noted. Psychiatric: Mood and affect are normal. Speech and behavior are normal. Patient exhibits appropriate insight and judgement.   ____________________________________________   LABS (all labs ordered are listed, but only abnormal results are displayed)  Labs Reviewed  BASIC METABOLIC PANEL - Abnormal; Notable for the following:    Glucose, Bld 120 (*)    All other components within normal limits  URINALYSIS COMPLETEWITH MICROSCOPIC ([facility] ONLY) - Abnormal; Notable for the following:    Color, Urine YELLOW (*)    APPearance CLEAR (*)    Hgb urine dipstick 2+ (*)    Leukocytes, UA TRACE (*)    Squamous Epithelial / LPF 0-5 (*)    All other components within normal limits  CBC WITH DIFFERENTIAL/PLATELET  URIC ACID   ____________________________________________  EKG  EKG revealed normal sinus rhythm with a ventricular rate of 94 bpm. No evidence of acute changes or STEMI. EKG also reviewed by Dr. Brenton Grills. ____________________________________________  RADIOLOGY I have personally viewed and evaluated these images (plain radiographs) as part of my medical decision making, as well as reviewing the written report by the radiologist.  Dg Ankle Complete Right  09/26/2015  CLINICAL DATA:  Right ankle pain, redness and swelling for the past week. No known injury. EXAM: RIGHT ANKLE - COMPLETE 3+ VIEW COMPARISON:  None. FINDINGS: Mild to moderate lateral soft tissue swelling. No effusion seen. Normal appearing bones. Accessory ossicle noted adjacent to the cuboid. IMPRESSION: Mild moderate lateral soft tissue swelling without underlying bony abnormality or effusion. Electronically Signed   By: Claudie Revering M.D.   On: 09/26/2015 17:58    ____________________________________________    PROCEDURES  Procedure(s) performed: None    Medications - No data to display   ____________________________________________  INITIAL IMPRESSION / ASSESSMENT AND PLAN / ED COURSE  Pertinent labs & imaging results that were available during my care of the patient were reviewed by me and considered in my medical decision making (see chart for details).  Patient's diagnosis is consistent with cellulitis of right lower leg and lower urinary tract infection. Patient will be discharged home with prescriptions for Keflex, and Norco and ibuprofen to take as directed. Patient was given crutches to limit weightbearing on the right lower leg to allow healing. Patient is to follow up with her primary care provider in 48 hours for recheck. Patient was given a work note to excuse from work over the next 48 hours to allow rest and no ambulation on the right foot and leg. Patient is given ED precautions to return to the ED for any worsening or new symptoms.    ____________________________________________  FINAL CLINICAL IMPRESSION(S) / ED DIAGNOSES  Final diagnoses:  Cellulitis of right lower leg  Lower urinary  tract infection      NEW MEDICATIONS STARTED DURING THIS VISIT:  New Prescriptions   CEPHALEXIN (KEFLEX) 500 MG CAPSULE    Take 1 capsule (500 mg total) by mouth 4 (four) times daily.   HYDROCODONE-ACETAMINOPHEN (NORCO) 5-325 MG TABLET    Take 1 tablet by mouth every 6 (six) hours as needed for severe pain.   IBUPROFEN (ADVIL,MOTRIN) 600 MG TABLET    Take 1 tablet (600 mg total) by mouth every 6 (six) hours as needed.         Braxton Feathers, PA-C 09/26/15 1911  Nance Pear, MD 09/26/15 2105

## 2015-09-26 NOTE — ED Notes (Signed)
  Right ankle pain X 1 week, redness and swelling present. No known injury. Pt alert and oriented X4, active, cooperative, pt in NAD. RR even and unlabored, color WNL.

## 2015-09-26 NOTE — Discharge Instructions (Signed)
Cellulitis Cellulitis is an infection of the skin and the tissue beneath it. The infected area is usually red and tender. Cellulitis occurs most often in the arms and lower legs.  CAUSES  Cellulitis is caused by bacteria that enter the skin through cracks or cuts in the skin. The most common types of bacteria that cause cellulitis are staphylococci and streptococci. SIGNS AND SYMPTOMS   Redness and warmth.  Swelling.  Tenderness or pain.  Fever. DIAGNOSIS  Your health care provider can usually determine what is wrong based on a physical exam. Blood tests may also be done. TREATMENT  Treatment usually involves taking an antibiotic medicine. HOME CARE INSTRUCTIONS   Take your antibiotic medicine as directed by your health care provider. Finish the antibiotic even if you start to feel better.  Keep the infected arm or leg elevated to reduce swelling.  Apply a warm cloth to the affected area up to 4 times per day to relieve pain.  Take medicines only as directed by your health care provider.  Keep all follow-up visits as directed by your health care provider. SEEK MEDICAL CARE IF:   You notice red streaks coming from the infected area.  Your red area gets larger or turns dark in color.  Your bone or joint underneath the infected area becomes painful after the skin has healed.  Your infection returns in the same area or another area.  You notice a swollen bump in the infected area.  You develop new symptoms.  You have a fever. SEEK IMMEDIATE MEDICAL CARE IF:   You feel very sleepy.  You develop vomiting or diarrhea.  You have a general ill feeling (malaise) with muscle aches and pains.   This information is not intended to replace advice given to you by your health care provider. Make sure you discuss any questions you have with your health care provider.   Document Released: 12/04/2004 Document Revised: 11/15/2014 Document Reviewed: 05/12/2011 Elsevier Interactive  Patient Education 2016 Elsevier Inc.  Urinary Tract Infection A urinary tract infection (UTI) can occur any place along the urinary tract. The tract includes the kidneys, ureters, bladder, and urethra. A type of germ called bacteria often causes a UTI. UTIs are often helped with antibiotic medicine.  HOME CARE   If given, take antibiotics as told by your doctor. Finish them even if you start to feel better.  Drink enough fluids to keep your pee (urine) clear or pale yellow.  Avoid tea, drinks with caffeine, and bubbly (carbonated) drinks.  Pee often. Avoid holding your pee in for a long time.  Pee before and after having sex (intercourse).  Wipe from front to back after you poop (bowel movement) if you are a woman. Use each tissue only once. GET HELP RIGHT AWAY IF:   You have back pain.  You have lower belly (abdominal) pain.  You have chills.  You feel sick to your stomach (nauseous).  You throw up (vomit).  Your burning or discomfort with peeing does not go away.  You have a fever.  Your symptoms are not better in 3 days. MAKE SURE YOU:   Understand these instructions.  Will watch your condition.  Will get help right away if you are not doing well or get worse.   This information is not intended to replace advice given to you by your health care provider. Make sure you discuss any questions you have with your health care provider.   Document Released: 08/13/2007 Document Revised: 03/17/2014 Document  Reviewed: 09/25/2011 Elsevier Interactive Patient Education [employer].

## 2015-12-11 ENCOUNTER — Telehealth: Payer: Self-pay | Admitting: Gastroenterology

## 2015-12-11 NOTE — Telephone Encounter (Signed)
colonoscopy

## 2015-12-17 ENCOUNTER — Telehealth: Payer: Self-pay

## 2015-12-17 ENCOUNTER — Other Ambulatory Visit: Payer: Self-pay

## 2015-12-17 NOTE — Telephone Encounter (Signed)
Gastroenterology Pre-Procedure Review  Request Date: 12/24/2015 Requesting Physician: Dr. Netty Starring  PATIENT REVIEW QUESTIONS: The patient responded to the following health history questions as indicated:    1. Are you having any GI issues? yes (Constipation) 2. Do you have a personal history of Polyps? no 3. Do you have a family history of Colon Cancer or Polyps? yes (mother benign polyps) 4. Diabetes Mellitus? no 5. Joint replacements in the past 12 months?no 6. Major health problems in the past 3 months?no 7. Any artificial heart valves, MVP, or defibrillator?no    MEDICATIONS & ALLERGIES:    Patient reports the following regarding taking any anticoagulation/antiplatelet therapy:   Plavix, Coumadin, Eliquis, Xarelto, Lovenox, Pradaxa, Brilinta, or Effient? no Aspirin? no  Patient confirms/reports the following medications:  No current outpatient prescriptions on file.   No current facility-administered medications for this visit.     Patient confirms/reports the following allergies:  Allergies  Allergen Reactions  . Morphine Other (See Comments)    Dramatic drop in BP  . Morphine And Related Other (See Comments)    LOW BP   . Penicillins Rash    No orders of the defined types were placed in this encounter.   AUTHORIZATION INFORMATION Primary Insurance: 1D#: Group #:  Secondary Insurance: 1D#: Group #:  SCHEDULE INFORMATION: Date: 12/24/2015 Time: Location: [facility]

## 2015-12-17 NOTE — Telephone Encounter (Signed)
Colonoscopy scheduled 12/24/2015 [facility]

## 2015-12-17 NOTE — Telephone Encounter (Signed)
Colonoscopy, constipation K59.00 [facility] [id number] [employer]  Pre cert is not required

## 2015-12-19 ENCOUNTER — Encounter: Payer: Self-pay | Admitting: *Deleted

## 2015-12-21 NOTE — Discharge Instructions (Signed)

## 2015-12-24 ENCOUNTER — Ambulatory Visit
Admission: RE | Admit: 2015-12-24 | Discharge: 2015-12-24 | Disposition: A | Discharge status: Home / Self Care | Payer: Managed Care, Other (non HMO) | Source: Ambulatory Visit | Attending: Gastroenterology | Admitting: Gastroenterology

## 2015-12-24 ENCOUNTER — Encounter
Admission: RE | Disposition: A | Discharge status: Home / Self Care | Payer: Self-pay | Source: Ambulatory Visit | Attending: Gastroenterology

## 2015-12-24 ENCOUNTER — Ambulatory Visit: Payer: Managed Care, Other (non HMO) | Admitting: Anesthesiology

## 2015-12-24 DIAGNOSIS — K621 Rectal polyp: Secondary | ICD-10-CM | POA: Diagnosis not present

## 2015-12-24 DIAGNOSIS — D125 Benign neoplasm of sigmoid colon: Secondary | ICD-10-CM | POA: Diagnosis not present

## 2015-12-24 DIAGNOSIS — K5900 Constipation, unspecified: Secondary | ICD-10-CM | POA: Diagnosis not present

## 2015-12-24 DIAGNOSIS — K641 Second degree hemorrhoids: Secondary | ICD-10-CM | POA: Insufficient documentation

## 2015-12-24 DIAGNOSIS — F17210 Nicotine dependence, cigarettes, uncomplicated: Secondary | ICD-10-CM | POA: Insufficient documentation

## 2015-12-24 DIAGNOSIS — K635 Polyp of colon: Secondary | ICD-10-CM | POA: Diagnosis not present

## 2015-12-24 HISTORY — DX: Presence of dental prosthetic device (complete) (partial): Z97.2

## 2015-12-24 HISTORY — PX: COLONOSCOPY WITH PROPOFOL: SHX5780

## 2015-12-24 HISTORY — PX: POLYPECTOMY: SHX5525

## 2015-12-24 HISTORY — DX: Presence of spectacles and contact lenses: Z97.3

## 2015-12-24 SURGERY — COLONOSCOPY WITH PROPOFOL
Anesthesia: Monitor Anesthesia Care | Wound class: Contaminated

## 2015-12-24 MED ORDER — LIDOCAINE HCL (CARDIAC) 20 MG/ML IV SOLN
INTRAVENOUS | Status: DC | PRN
  Administered 2015-12-24: 30 mg via INTRAVENOUS

## 2015-12-24 MED ORDER — PROPOFOL 10 MG/ML IV BOLUS
INTRAVENOUS | Status: DC | PRN
  Administered 2015-12-24: 50 mg via INTRAVENOUS
  Administered 2015-12-24: 10 mg via INTRAVENOUS
  Administered 2015-12-24 (×7): 50 mg via INTRAVENOUS

## 2015-12-24 MED ORDER — LACTATED RINGERS IV SOLN
INTRAVENOUS | Status: DC
  Administered 2015-12-24: 09:00:00 via INTRAVENOUS

## 2015-12-24 MED ORDER — SIMETHICONE 40 MG/0.6ML PO SUSP
ORAL | Status: DC | PRN
  Administered 2015-12-24: 10:00:00

## 2015-12-24 SURGICAL SUPPLY — 23 items

## 2015-12-24 NOTE — H&P (Signed)
  Lucilla Lame, MD [facility] [address] [place], [place] [address] Phone: [phone] Fax : [phone]  Primary Care Physician:  Dion Body, MD Primary Gastroenterologist:  Dr. Allen Norris  Pre-Procedure History & Physical: HPI:  Nichole Hall is a 50 y.o. female is here for an colonoscopy.   Past Medical History:  Diagnosis Date  . Anxiety   . Wears contact lenses   . Wears dentures    partial upper    Past Surgical History:  Procedure Laterality Date  . ABDOMINAL HYSTERECTOMY    . CHOLECYSTECTOMY    . EYE SURGERY    . TUBAL LIGATION      Prior to Admission medications   Not on File    Allergies as of 12/17/2015 - Review Complete 12/17/2015  Allergen Reaction Noted  . Morphine Other (See Comments) 05/04/2014  . Morphine and related Other (See Comments) 01/27/2015  . Penicillins Rash 04/25/2014    History reviewed. No pertinent family history.  Social History   Social History  . Marital status: Single    Spouse name: N/A  . Number of children: N/A  . Years of education: N/A   Occupational History  . Not on file.   Social History Main Topics  . Smoking status: Current Every Day Smoker    Packs/day: 0.50    Years: 25.00    Types: Cigarettes  . Smokeless tobacco: Never Used  . Alcohol use Yes     Comment: 2-3 glasses wine/ month  . Drug use: No  . Sexual activity: Not on file   Other Topics Concern  . Not on file   Social History Narrative  . No narrative on file    Review of Systems: See HPI, otherwise negative ROS  Physical Exam: BP 105/73   Pulse 63   Temp 97.1 F (36.2 C) (Temporal)   Resp 16   Ht 5\' 9"  (1.753 m)   Wt 176 lb (79.8 kg)   SpO2 99%   BMI 25.99 kg/m  General:   Alert,  pleasant and cooperative in NAD Head:  Normocephalic and atraumatic. Neck:  Supple; no masses or thyromegaly. Lungs:  Clear throughout to auscultation.    Heart:  Regular rate and rhythm. Abdomen:  Soft, nontender and nondistended. Normal  bowel sounds, without guarding, and without rebound.   Neurologic:  Alert and  oriented x4;  grossly normal neurologically.  Impression/Plan: Nichole Hall is here for an colonoscopy to be performed for constipation  Risks, benefits, limitations, and alternatives regarding  colonoscopy have been reviewed with the patient.  Questions have been answered.  All parties agreeable.   Lucilla Lame, MD  12/24/2015, 9:05 AM

## 2015-12-24 NOTE — Op Note (Signed)
[facility] Gastroenterology Patient Name: Nichole Hall Procedure Date: 12/24/2015 9:26 AM MRN: [id number] Account #: [id number] Date of Birth: 20-Aug-1965 Admit Type: Outpatient Age: 50 Room: [facility] OR ROOM 01 Gender: Female Note Status: Finalized Procedure:            Colonoscopy Indications:          Constipation Providers:            Lucilla Lame MD, MD Referring MD:         Dion Body (Referring MD) Medicines:            Propofol per Anesthesia Complications:        No immediate complications. Procedure:            Pre-Anesthesia Assessment:                       - Prior to the procedure, a History and Physical was                        performed, and patient medications and allergies were                        reviewed. The patient's tolerance of previous                        anesthesia was also reviewed. The risks and benefits of                        the procedure and the sedation options and risks were                        discussed with the patient. All questions were                        answered, and informed consent was obtained. Prior                        Anticoagulants: The patient has taken no previous                        anticoagulant or antiplatelet agents. ASA Grade                        Assessment: II - A patient with mild systemic disease.                        After reviewing the risks and benefits, the patient was                        deemed in satisfactory condition to undergo the                        procedure.                       After obtaining informed consent, the colonoscope was                        passed under direct vision. Throughout the procedure,  the patient's blood pressure, pulse, and oxygen                        saturations were monitored continuously. The Olympus CF                        H180AL colonoscope (S#: [id number]) was introduced through                        the  anus and advanced to the the cecum, identified by                        appendiceal orifice and ileocecal valve. The                        colonoscopy was performed without difficulty. The                        patient tolerated the procedure well. The quality of                        the bowel preparation was excellent. Findings:      The perianal and digital rectal examinations were normal.      A 5 mm polyp was found in the sigmoid colon. The polyp was sessile. The       polyp was removed with a cold snare. Resection and retrieval were       complete.      Three sessile polyps were found in the rectum. The polyps were 2 to 3 mm       in size. These polyps were removed with a cold snare. Resection and       retrieval were complete.      Non-bleeding internal hemorrhoids were found during retroflexion. The       hemorrhoids were Grade II (internal hemorrhoids that prolapse but reduce       spontaneously). Impression:           - One 5 mm polyp in the sigmoid colon, removed with a                        cold snare. Resected and retrieved.                       - Three 2 to 3 mm polyps in the rectum, removed with a                        cold snare. Resected and retrieved.                       - Non-bleeding internal hemorrhoids. Recommendation:       - Await pathology results.                       - Repeat colonoscopy in 5 years if polyp adenoma and 10                        years if hyperplastic                       - Discharge patient to home.                       -  Resume previous diet.                       - Continue present medications.                       - Use fiber, for example Citrucel, Fibercon, Konsyl or                        Metamucil. Procedure Code(s):    --- Professional ---                       [phone], Colonoscopy, flexible; with removal of tumor(s),                        polyp(s), or other lesion(s) by snare technique Diagnosis Code(s):    ---  Professional ---                       K59.00, Constipation, unspecified                       D12.5, Benign neoplasm of sigmoid colon                       K62.1, Rectal polyp CPT copyright 2016 American Medical Association. All rights reserved. The codes documented in this report are preliminary and upon coder review may  be revised to meet current compliance requirements. Lucilla Lame MD, MD 12/24/2015 9:57:34 AM This report has been signed electronically. Number of Addenda: 0 Note Initiated On: 12/24/2015 9:26 AM Scope Withdrawal Time: 0 hours 8 minutes 30 seconds  Total Procedure Duration: 0 hours 16 minutes 16 seconds       [facility]

## 2015-12-24 NOTE — Anesthesia Preprocedure Evaluation (Signed)
Anesthesia Evaluation  Patient identified by MRN, date of birth, ID band Patient awake    Reviewed: Allergy & Precautions, H&P , NPO status , Patient's Chart, lab work & pertinent test results  Airway Mallampati: II  TM Distance: >3 FB Neck ROM: full    Dental no notable dental hx.    Pulmonary Current Smoker,    Pulmonary exam normal        Cardiovascular Normal cardiovascular exam     Neuro/Psych    GI/Hepatic   Endo/Other    Renal/GU      Musculoskeletal   Abdominal   Peds  Hematology   Anesthesia Other Findings H/o irreg HR  Reproductive/Obstetrics                             Anesthesia Physical Anesthesia Plan  ASA: II  Anesthesia Plan: MAC   Post-op Pain Management:    Induction:   Airway Management Planned:   Additional Equipment:   Intra-op Plan:   Post-operative Plan:   Informed Consent: I have reviewed the patients History and Physical, chart, labs and discussed the procedure including the risks, benefits and alternatives for the proposed anesthesia with the patient or authorized representative who has indicated his/her understanding and acceptance.     Plan Discussed with:   Anesthesia Plan Comments:         Anesthesia Quick Evaluation

## 2015-12-24 NOTE — Anesthesia Postprocedure Evaluation (Signed)
Anesthesia Post Note  Patient: Nichole Hall  Procedure(s) Performed: Procedure(s) (LRB): COLONOSCOPY WITH PROPOFOL (N/A) POLYPECTOMY  Patient location during evaluation: PACU Anesthesia Type: MAC Level of consciousness: awake and alert and oriented Pain management: satisfactory to patient Vital Signs Assessment: post-procedure vital signs reviewed and stable Respiratory status: spontaneous breathing, nonlabored ventilation and respiratory function stable Cardiovascular status: blood pressure returned to baseline and stable Postop Assessment: Adequate PO intake and No signs of nausea or vomiting Anesthetic complications: no    Raliegh Ip

## 2015-12-24 NOTE — Transfer of Care (Signed)
Immediate Anesthesia Transfer of Care Note  Patient: Nichole Hall  Procedure(s) Performed: Procedure(s): COLONOSCOPY WITH PROPOFOL (N/A) POLYPECTOMY  Patient Location: PACU  Anesthesia Type: MAC  Level of Consciousness: awake, alert  and patient cooperative  Airway and Oxygen Therapy: Patient Spontanous Breathing and Patient connected to supplemental oxygen  Post-op Assessment: Post-op Vital signs reviewed, Patient's Cardiovascular Status Stable, Respiratory Function Stable, Patent Airway and No signs of Nausea or vomiting  Post-op Vital Signs: Reviewed and stable  Complications: No apparent anesthesia complications

## 2015-12-25 ENCOUNTER — Encounter: Payer: Self-pay | Admitting: Gastroenterology

## 2015-12-26 ENCOUNTER — Encounter: Payer: Self-pay | Admitting: Gastroenterology

## 2016-05-27 ENCOUNTER — Ambulatory Visit
Admission: RE | Admit: 2016-05-27 | Discharge: 2016-05-27 | Disposition: A | Discharge status: Home / Self Care | Payer: Managed Care, Other (non HMO) | Source: Ambulatory Visit | Attending: Internal Medicine | Admitting: Internal Medicine

## 2016-05-27 ENCOUNTER — Ambulatory Visit
Admission: RE | Admit: 2016-05-27 | Discharge: 2016-05-27 | Disposition: A | Discharge status: Home / Self Care | Payer: Managed Care, Other (non HMO) | Source: Ambulatory Visit | Attending: Cardiology | Admitting: Cardiology

## 2016-05-27 ENCOUNTER — Telehealth: Payer: Managed Care, Other (non HMO) | Admitting: Nurse Practitioner

## 2016-05-27 ENCOUNTER — Other Ambulatory Visit: Payer: Self-pay | Admitting: Cardiology

## 2016-05-27 DIAGNOSIS — M5489 Other dorsalgia: Secondary | ICD-10-CM

## 2016-05-27 DIAGNOSIS — M2578 Osteophyte, vertebrae: Secondary | ICD-10-CM | POA: Insufficient documentation

## 2016-05-27 DIAGNOSIS — M47816 Spondylosis without myelopathy or radiculopathy, lumbar region: Secondary | ICD-10-CM | POA: Diagnosis not present

## 2016-05-27 DIAGNOSIS — M545 Low back pain: Secondary | ICD-10-CM

## 2016-05-27 MED ORDER — CYCLOBENZAPRINE HCL 10 MG PO TABS: 10.0000 mg | ORAL_TABLET | Freq: Three times a day (TID) | ORAL | 0 refills | Status: AC | PRN

## 2016-05-27 MED ORDER — ETODOLAC 300 MG PO CAPS: 300.0000 mg | ORAL_CAPSULE | Freq: Two times a day (BID) | ORAL | 1 refills | Status: AC

## 2016-05-27 NOTE — Progress Notes (Signed)

## 2017-05-27 ENCOUNTER — Other Ambulatory Visit: Payer: Self-pay

## 2017-05-27 ENCOUNTER — Ambulatory Visit: Payer: 59 | Admitting: Nurse Practitioner

## 2017-05-27 ENCOUNTER — Encounter: Payer: Self-pay | Admitting: Nurse Practitioner

## 2017-05-27 VITALS — BP 95/56 | HR 68 | Temp 98.2°F | Ht 69.0 in | Wt 186.0 lb

## 2017-05-27 DIAGNOSIS — Z9889 Other specified postprocedural states: Secondary | ICD-10-CM

## 2017-05-27 DIAGNOSIS — R42 Dizziness and giddiness: Secondary | ICD-10-CM

## 2017-05-27 DIAGNOSIS — Z23 Encounter for immunization: Secondary | ICD-10-CM | POA: Diagnosis not present

## 2017-05-27 DIAGNOSIS — Z7689 Persons encountering health services in other specified circumstances: Secondary | ICD-10-CM

## 2017-05-27 DIAGNOSIS — R5383 Other fatigue: Secondary | ICD-10-CM | POA: Diagnosis not present

## 2017-05-27 DIAGNOSIS — M1990 Unspecified osteoarthritis, unspecified site: Secondary | ICD-10-CM | POA: Insufficient documentation

## 2017-05-27 MED ORDER — DICLOFENAC SODIUM 75 MG PO TBEC: 75.0000 mg | DELAYED_RELEASE_TABLET | Freq: Two times a day (BID) | ORAL | 2 refills | Status: DC

## 2017-05-27 NOTE — Assessment & Plan Note (Signed)
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  She has had inadequate conservative/NSAID therapy to date.  Plan: 1. Continue acetaminophen prn. 2. START diclofenac 75 mg bid x 14 days then take bid prn. 3. Labs: CBC, CMP, ESR, CRP, ANA, Rheumatoid Factor. - Consider referral to rheumatology if results are suggestive of autoimmune cause. 4. Followup as needed and after lab results have returned.

## 2017-05-27 NOTE — Patient Instructions (Signed)
Nichole Hall, Thank you for coming in to clinic today.  1.  For your dizziness: - Drink at least 64 ounces of water per day. -Continue eating normal salt diet. -Labs in clinic today to check electrolytes and blood counts. -Monitor frequency of symptoms.  If occurring for longer than another 2 weeks please call clinic.  2.  For your arthritis: -Labs in clinic today -Start taking diclofenac 75 mg twice a day for at least 14 days.  After 14 days, can change to as needed dosing.  3.  For your hormone replacement therapy and smoking: -Continue working to reduce smoking.  If you are not able to completely quit in the next 6-8 weeks, we can discuss medication therapy.   Please schedule a follow-up appointment with Cassell Smiles, AGNP. Return in about 8 weeks (around 07/22/2017) for smoking cessation, hormone therapy.  If you have any other questions or concerns, please feel free to call the clinic or send a message through [place]. You may also schedule an earlier appointment if necessary.  You will receive a survey after today's visit either digitally by e-mail or paper by [employer]. Your experiences and feedback matter to [place].  Please respond so we know how we are doing as we provide care for you.   Cassell Smiles, DNP, AGNP-BC Adult Gerontology Nurse Practitioner [facility]   Orthostatic Hypotension Orthostatic hypotension is a sudden drop in blood pressure that happens when you quickly change positions, such as when you get up from a seated or lying position. Blood pressure is a measurement of how strongly, or weakly, your blood is pressing against the walls of your arteries. Arteries are blood vessels that carry blood from your heart throughout your body. When blood pressure is too low, you may not get enough blood to your brain or to the rest of your organs. This can cause weakness, light-headedness, rapid heartbeat, and fainting. This can last for just a few seconds or  for up to a few minutes. Orthostatic hypotension is usually not a serious problem. However, if it happens frequently or gets worse, it may be a sign of something more serious. What are the causes? This condition may be caused by:  Sudden changes in posture, such as standing up quickly after you have been sitting or lying down.  Blood loss.  Loss of body fluids (dehydration).  Heart problems.  Hormone (endocrine) problems.  Pregnancy.  Severe infection.  Lack of certain nutrients.  Severe allergic reactions (anaphylaxis).  Certain medicines, such as blood pressure medicine or medicines that make the body lose excess fluids (diuretics). Sometimes, this condition can be caused by not taking medicine as directed, such as taking too much of a certain medicine.  What increases the risk? Certain factors can make you more likely to develop orthostatic hypotension, including:  Age. Risk increases as you get older.  Conditions that affect the heart or the central nervous system.  Taking certain medicines, such as blood pressure medicine or diuretics.  Being pregnant.  What are the signs or symptoms? Symptoms of this condition may include:  Weakness.  Light-headedness.  Dizziness.  Blurred vision.  Fatigue.  Rapid heartbeat.  Fainting, in severe cases.  How is this diagnosed? This condition is diagnosed based on:  Your medical history.  Your symptoms.  Your blood pressure measurement. Your health care provider will check your blood pressure when you are: ? Lying down. ? Sitting. ? Standing.  A blood pressure reading is recorded as  two numbers, such as "120 over 80" (or 120/80). The first ("top") number is called the systolic pressure. It is a measure of the pressure in your arteries as your heart beats. The second ("bottom") number is called the diastolic pressure. It is a measure of the pressure in your arteries when your heart relaxes between beats. Blood  pressure is measured in a unit called mm Hg. Healthy blood pressure for adults is 120/80. If your blood pressure is below 90/60, you may be diagnosed with hypotension. Other information or tests that may be used to diagnose orthostatic hypotension include:  Your other vital signs, such as your heart rate and temperature.  Blood tests.  Tilt table test. For this test, you will be safely secured to a table that moves you from a lying position to an upright position. Your heart rhythm and blood pressure will be monitored during the test.  How is this treated? Treatment for this condition may include:  Changing your diet. This may involve eating more salt (sodium) or drinking more water.  Taking medicines to raise your blood pressure.  Changing the dosage of certain medicines you are taking that might be lowering your blood pressure.  Wearing compression stockings. These stockings help to prevent blood clots and reduce swelling in your legs.  In some cases, you may need to go to the hospital for:  Fluid replacement. This means you will receive fluids through an IV tube.  Blood replacement. This means you will receive donated blood through an IV tube (transfusion).  Treating an infection or heart problems, if this applies.  Monitoring. You may need to be monitored while medicines that you are taking wear off.  Follow these instructions at home: Eating and drinking   Drink enough fluid to keep your urine clear or pale yellow.  Eat a healthy diet and follow instructions from your health care provider about eating or drinking restrictions. A healthy diet includes: ? Fresh fruits and vegetables. ? Whole grains. ? Lean meats. ? Low-fat dairy products.  Eat extra salt only as directed. Do not add extra salt to your diet unless your health care provider told you to do that.  Eat frequent, small meals.  Avoid standing up suddenly after eating. Medicines  Take over-the-counter and  prescription medicines only as told by your health care provider. ? Follow instructions from your health care provider about changing the dosage of your current medicines, if this applies. ? Do not stop or adjust any of your medicines on your own. General instructions  Wear compression stockings as told by your health care provider.  Get up slowly from lying down or sitting positions. This gives your blood pressure a chance to adjust.  Avoid hot showers and excessive heat as directed by your health care provider.  Return to your normal activities as told by your health care provider. Ask your health care provider what activities are safe for you.  Do not use any products that contain nicotine or tobacco, such as cigarettes and e-cigarettes. If you need help quitting, ask your health care provider.  Keep all follow-up visits as told by your health care provider. This is important. Contact a health care provider if:  You vomit.  You have diarrhea.  You have a fever for more than 2-3 days.  You feel more thirsty than usual.  You feel weak and tired. Get help right away if:  You have chest pain.  You have a fast or irregular heartbeat.  You  develop numbness in any part of your body.  You cannot move your arms or your legs.  You have trouble speaking.  You become sweaty or feel lightheaded.  You faint.  You feel short of breath.  You have trouble staying awake.  You feel confused. This information is not intended to replace advice given to you by your health care provider. Make sure you discuss any questions you have with your health care provider. Document Released: 02/14/2002 Document Revised: 11/13/2015 Document Reviewed: 08/17/2015 Elsevier Interactive Patient Education  2018 [employer].

## 2017-05-27 NOTE — Assessment & Plan Note (Signed)
Pt with history of lumpectomy of left breast with prior recommendations for Q11month mammography surveillance.  No prior mammogram available at this time, but pt states last mammo was performed more than 1 year ago.  Plan: 1. Diagnostic bilateral mammo. External order for [facility] has been requested by patient. 2. Followup as needed and for repeat mammo per recommendations of radiology.  Consider extention back to annual surveillance if last 2 years have been normal and without changes.

## 2017-05-27 NOTE — Progress Notes (Signed)
Subjective:    Patient ID: Nichole Hall, female    DOB: [birth year], 52 y.o.   MRN: [id number]  Nichole Hall is a 52 y.o. female presenting on 05/27/2017 for Establish Care (transferring care from Dr. Diona Foley, need mammogram); Arthritis (pain and knots on fingers ); and Dizziness (lightheadedness w/ sudden movement )   HPI Establish Care New Provider Pt last seen by PCP Dr. Lavera Guise at [facility] about 1 year ago.  Obtain records. - Has most recently had ov for arthritis and insomnia  Perimenopausal vasomotor symptoms - Pt is also cared for recently by OBGYN who has checked labs for menopausal status.  She was confirmed to be peri-menopausal.  Vasomotor symptoms have improved with use of estradiol patch 0.05 mcg daily.  She is continuing to smoke.   Arthritis Pain and knots on fingers.  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 Is not taking medication other than tylenol and has tried excedrin back and body as well without relief.  To date, has had no labs for evaluation of causes of arthritis.    Dizziness Last 2 weeks, has had dizziness "feeling swimmy headed" and is not classified as having a spinning sensation.  Instead, she feels disoriented with resolution several seconds after changing positions.  This sensation occurs most often when she is changing positions from sitting to standing or from lying to sitting.  She does not drink at least 64 oz water daily.  Avoids caffeine, but states she used to drink several [employer] sodas per day. Pt has had no recent medication changes that could be contributing to symptoms.  Past Medical History:  Diagnosis Date  . Anxiety   . Osteopenia   . Wears contact lenses   . Wears dentures    partial upper   Past Surgical History:  Procedure Laterality Date  . ABDOMINAL HYSTERECTOMY    . CHOLECYSTECTOMY    . COLONOSCOPY WITH PROPOFOL N/A 12/24/2015   Procedure: COLONOSCOPY WITH PROPOFOL;   Surgeon: Lucilla Lame, MD;  Location: [place];  Service: Endoscopy;  Laterality: N/A;  . EYE SURGERY    . POLYPECTOMY  12/24/2015   Procedure: POLYPECTOMY;  Surgeon: Lucilla Lame, MD;  Location: [place];  Service: Endoscopy;;  . TUBAL LIGATION     Social History   Socioeconomic History  . Marital status: Single    Spouse name: Not on file  . Number of children: Not on file  . Years of education: Not on file  . Highest education level: Not on file  Social Needs  . Financial resource strain: Not on file  . Food insecurity - worry: Not on file  . Food insecurity - inability: Not on file  . Transportation needs - medical: Not on file  . Transportation needs - non-medical: Not on file  Occupational History  . Not on file  Tobacco Use  . Smoking status: Current Every Day Smoker    Packs/day: 0.50    Years: 30.00    Pack years: 15.00    Types: Cigarettes  . Smokeless tobacco: Never Used  Substance and Sexual Activity  . Alcohol use: Yes    Comment: 2-3 glasses wine/ month  . Drug use: No  . Sexual activity: Yes    Birth control/protection: None  Other Topics Concern  . Not on file  Social History Narrative  . Not on file   Family History  Problem Relation Age of Onset  . COPD  Mother   . Arthritis/Rheumatoid Mother   . Hypertension Mother   . Anxiety disorder Sister   . Stroke Maternal Grandmother   . Cancer - Lung Maternal Grandfather   . Heart attack Maternal Grandfather    Current Outpatient Medications on File Prior to Visit  Medication Sig  . estradiol (VIVELLE-DOT) 0.05 MG/24HR patch Place onto the skin.   No current facility-administered medications on file prior to visit.     Review of Systems  Constitutional: Negative.   HENT: Negative.   Eyes: Negative.   Respiratory: Negative.   Cardiovascular: Negative.   Gastrointestinal: Negative.   Endocrine: Positive for heat intolerance (postmenopausal hot flashes).  Genitourinary:  Negative.   Musculoskeletal: Positive for arthralgias.  Skin: Negative.   Allergic/Immunologic: Negative.   Neurological: Positive for dizziness.  Hematological: Negative.   Psychiatric/Behavioral: Negative.    Per HPI unless specifically indicated above     Objective:    BP (!) 95/56 (BP Location: Right Arm, Patient Position: Sitting, Cuff Size: Normal)   Pulse 68   Temp 98.2 F (36.8 C) (Oral)   Ht 5' 9" (1.753 m)   Wt 186 lb (84.4 kg)   BMI 27.47 kg/m   Wt Readings from Last 3 Encounters:  05/27/17 186 lb (84.4 kg)  12/24/15 176 lb (79.8 kg)  09/26/15 176 lb (79.8 kg)    Physical Exam  Constitutional: She is oriented to person, place, and time. She appears well-developed and well-nourished. No distress.  HENT:  Head: Normocephalic and atraumatic.  Neck: Normal range of motion. Neck supple.  Cardiovascular: Normal rate, regular rhythm, S1 normal, S2 normal, normal heart sounds and intact distal pulses.  Pulmonary/Chest: Effort normal and breath sounds normal. No respiratory distress.  Musculoskeletal:  Bilateral hands with heberden's nodes and periarticular edema. Grip strength normal, pincer grasp normal.  Neurological: She is alert and oriented to person, place, and time. No cranial nerve deficit. Gait normal.  Forward head tilt and dix-hallpike negative today.  Skin: Skin is warm and dry.  Psychiatric: She has a normal mood and affect. Her behavior is normal.  Vitals reviewed.   Results for orders placed or performed during the hospital encounter of 70/96/28  Basic metabolic panel  Result Value Ref Range   Sodium 137 135 - 145 mmol/L   Potassium 3.6 3.5 - 5.1 mmol/L   Chloride 106 101 - 111 mmol/L   CO2 22 22 - 32 mmol/L   Glucose, Bld 120 (H) 65 - 99 mg/dL   BUN 10 6 - 20 mg/dL   Creatinine, Ser 0.81 0.44 - 1.00 mg/dL   Calcium 9.1 8.9 - 10.3 mg/dL   GFR calc non Af Amer >60 >60 mL/min   GFR calc Af Amer >60 >60 mL/min   Anion gap 9 5 - 15  CBC with  Differential  Result Value Ref Range   WBC 10.4 3.6 - 11.0 K/uL   RBC 4.76 3.80 - 5.20 MIL/uL   Hemoglobin 14.7 12.0 - 16.0 g/dL   HCT 42.6 35.0 - 47.0 %   MCV 89.4 80.0 - 100.0 fL   MCH 30.9 26.0 - 34.0 pg   MCHC 34.5 32.0 - 36.0 g/dL   RDW 14.0 11.5 - 14.5 %   Platelets 216 150 - 440 K/uL   Neutrophils Relative % 62 %   Neutro Abs 6.5 1.4 - 6.5 K/uL   Lymphocytes Relative 28 %   Lymphs Abs 2.9 1.0 - 3.6 K/uL   Monocytes Relative 6 %  Monocytes Absolute 0.6 0.2 - 0.9 K/uL   Eosinophils Relative 3 %   Eosinophils Absolute 0.3 0 - 0.7 K/uL   Basophils Relative 1 %   Basophils Absolute 0.1 0 - 0.1 K/uL  Uric acid  Result Value Ref Range   Uric Acid, Serum 4.8 2.3 - 6.6 mg/dL  Urinalysis complete, with microscopic  Result Value Ref Range   Color, Urine YELLOW (A) YELLOW   APPearance CLEAR (A) CLEAR   Glucose, UA NEGATIVE NEGATIVE mg/dL   Bilirubin Urine NEGATIVE NEGATIVE   Ketones, ur NEGATIVE NEGATIVE mg/dL   Specific Gravity, Urine 1.008 1.005 - 1.030   Hgb urine dipstick 2+ (A) NEGATIVE   pH 5.0 5.0 - 8.0   Protein, ur NEGATIVE NEGATIVE mg/dL   Nitrite NEGATIVE NEGATIVE   Leukocytes, UA TRACE (A) NEGATIVE   RBC / HPF 0-5 0 - 5 RBC/hpf   WBC, UA 0-5 0 - 5 WBC/hpf   Bacteria, UA NONE SEEN NONE SEEN   Squamous Epithelial / LPF 0-5 (A) NONE SEEN      Assessment & Plan:   Problem List Items Addressed This Visit      Musculoskeletal and Integument   Arthritis    -  Primary    Chronic joint pain in multiple joints of bilateral hands 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  She has had inadequate conservative/NSAID therapy to date.  Plan: 1. Continue acetaminophen prn. 2. START diclofenac 75 mg bid x 14 days then take bid prn. 3. Labs: CBC, CMP, ESR, CRP, ANA, Rheumatoid Factor. - Consider referral to rheumatology if results are suggestive of autoimmune cause. 4. Followup as needed and after lab results have returned.      Relevant Medications     diclofenac (VOLTAREN) 75 MG EC tablet   Other Relevant Orders   C-reactive protein   Rheumatoid Factor   Sed Rate (ESR)   Antinuclear Antib (ANA)     Other   History of lumpectomy of left breast    Pt with history of lumpectomy of left breast with prior recommendations for Q24monthmammography surveillance.  No prior mammogram available at this time, but pt states last mammo was performed more than 1 year ago.  Plan: 1. Diagnostic bilateral mammo. [facility]. 2. Followup as needed and for repeat mammo per recommendations of radiology.  Consider extention back to annual surveillance if last 2 years have been normal and without changes.      Relevant Orders   MM Digital Diagnostic Bilat    Other Visit Diagnoses    Need for immunization against influenza    Pt desires flu vaccination today.  Will administer quadrivalent flu vaccine.   Relevant Orders   Flu Vaccine QUAD 6+ mos PF IM (Fluarix Quad PF) (Completed)   Dizziness     Acute onset of postural dizziness over the last 2 weeks that is transient in nature.  Pt with mild hypotension today.  Also with slight decrease of BP when moving from lying to sitting with check of orthostatic BP readings.  Change not clinically significant to diagnose orthostatic hypotension.  No clinical correlation to vertigo.    Plan: 1. CBC and CMP today 2. Requested pt to ensure she drinks enough water, eats normal salt diet. 3. Monitor symptoms and return to clinic if not resolving in next 2-4 weeks.   Relevant Orders   COMPLETE METABOLIC PANEL WITH GFR   CBC with Differential  Encounter to establish care       Pt with transfer of care from Dr. Lavera Guise with last visit about 1 year ago.  Pt medical, surgical, and family history reviewed in clinic today.      Meds ordered this encounter  Medications  . diclofenac (VOLTAREN) 75 MG EC tablet    Sig: Take 1 tablet (75 mg total) by mouth 2 (two)  times daily.    Dispense:  60 tablet    Refill:  2    Order Specific Question:   Supervising Provider    Answer:   Olin Hauser [[id number]]      Follow up plan: Return in about 8 weeks (around 07/22/2017) for smoking cessation, hormone therapy.  Cassell Smiles, DNP, AGPCNP-BC Adult Gerontology Primary Care Nurse Practitioner [facility] 05/27/2017, 2:42 PM

## 2017-05-27 NOTE — Progress Notes (Signed)
Influenza injection administered in left arm. Pt tolerated w/o any complications.

## 2017-05-29 ENCOUNTER — Encounter: Payer: Self-pay | Admitting: Nurse Practitioner

## 2017-05-29 ENCOUNTER — Telehealth: Payer: Self-pay | Admitting: Student

## 2017-05-29 LAB — ANA: Anti Nuclear Antibody(ANA): NEGATIVE

## 2017-05-29 LAB — COMPLETE METABOLIC PANEL WITH GFR
AG Ratio: 1.5 (calc) (ref 1.0–2.5)
ALT: 12 U/L (ref 6–29)
AST: 21 U/L (ref 10–35)
Albumin: 4.4 g/dL (ref 3.6–5.1)
Alkaline phosphatase (APISO): 61 U/L (ref 33–115)
BUN: 9 mg/dL (ref 7–25)
CO2: 28 mmol/L (ref 20–32)
Calcium: 9.5 mg/dL (ref 8.6–10.2)
Chloride: 103 mmol/L (ref 98–110)
Creat: 0.97 mg/dL (ref 0.50–1.10)
GFR, Est African American: 79 mL/min/{1.73_m2} (ref >=60)
GFR, Est Non African American: 69 mL/min/{1.73_m2} (ref >=60)
Globulin: 3 g/dL (calc) (ref 1.9–3.7)
Glucose, Bld: 96 mg/dL (ref 65–99)
Potassium: 4.7 mmol/L (ref 3.5–5.3)
Sodium: 137 mmol/L (ref 135–146)
Total Bilirubin: 0.3 mg/dL (ref 0.2–1.2)
Total Protein: 7.4 g/dL (ref 6.1–8.1)

## 2017-05-29 LAB — CBC WITH DIFFERENTIAL/PLATELET
Basophils Absolute: 72 cells/uL (ref 0–200)
Basophils Relative: 0.8 %
Eosinophils Absolute: 180 cells/uL (ref 15–500)
Eosinophils Relative: 2 %
HCT: 44.2 % (ref 35.0–45.0)
Hemoglobin: 15.1 g/dL (ref 11.7–15.5)
Lymphs Abs: 2466 cells/uL (ref 850–3900)
MCH: 30.3 pg (ref 27.0–33.0)
MCHC: 34.2 g/dL (ref 32.0–36.0)
MCV: 88.8 fL (ref 80.0–100.0)
MPV: 10.7 fL (ref 7.5–12.5)
Monocytes Relative: 6 %
Neutro Abs: 5742 cells/uL (ref 1500–7800)
Neutrophils Relative %: 63.8 %
Platelets: 277 10*3/uL (ref 140–400)
RBC: 4.98 10*6/uL (ref 3.80–5.10)
RDW: 12.4 % (ref 11.0–15.0)
Total Lymphocyte: 27.4 %
WBC mixed population: 540 cells/uL (ref 200–950)
WBC: 9 10*3/uL (ref 3.8–10.8)

## 2017-05-29 LAB — RHEUMATOID FACTOR: Rhuematoid fact SerPl-aCnc: <14 IU/mL (ref <14)

## 2017-05-29 LAB — C-REACTIVE PROTEIN: CRP: 3.5 mg/L (ref <8.0)

## 2017-05-29 LAB — SEDIMENTATION RATE: Sed Rate: 2 mm/h (ref 0–20)

## 2017-05-29 NOTE — Addendum Note (Signed)
Addended by: Cleaster Corin on: 05/29/2017 01:35 PM   Modules accepted: Orders

## 2017-05-29 NOTE — Telephone Encounter (Signed)
Nichole Hall at [facility] has question about pt's mammo order [phone]

## 2017-06-01 ENCOUNTER — Telehealth: Payer: Self-pay | Admitting: Nurse Practitioner

## 2017-06-01 NOTE — Telephone Encounter (Signed)
Pt is having a few issues with her breast and asked to speed up mammo.  Her call back number is [phone]

## 2017-06-01 NOTE — Telephone Encounter (Signed)
Pt notified and also reply her my chart message.

## 2017-06-05 LAB — HM MAMMOGRAPHY

## 2017-06-30 ENCOUNTER — Telehealth: Payer: Managed Care, Other (non HMO) | Admitting: Family

## 2017-06-30 DIAGNOSIS — B9689 Other specified bacterial agents as the cause of diseases classified elsewhere: Secondary | ICD-10-CM

## 2017-06-30 DIAGNOSIS — J019 Acute sinusitis, unspecified: Secondary | ICD-10-CM

## 2017-06-30 MED ORDER — AZITHROMYCIN 250 MG PO TABS: ORAL_TABLET | ORAL | 0 refills | Status: DC

## 2017-06-30 NOTE — Progress Notes (Signed)
We are sorry that you are not feeling well.  Here is how we plan to help!  Based on what you have shared with me it looks like you have sinusitis.  Sinusitis is inflammation and infection in the sinus cavities of the head.  Based on your presentation I believe you most likely have Acute Bacterial Sinusitis.  This is an infection caused by bacteria and is treated with antibiotics. I have prescribed Z-pak You may use an oral decongestant such as Mucinex D or if you have glaucoma or high blood pressure use plain Mucinex. Saline nasal spray help and can safely be used as often as needed for congestion.  If you develop worsening sinus pain, fever or notice severe headache and vision changes, or if symptoms are not better after completion of antibiotic, please schedule an appointment with a health care provider.    Sinus infections are not as easily transmitted as other respiratory infection, however we still recommend that you avoid close contact with loved ones, especially the very young and elderly.  Remember to wash your hands thoroughly throughout the day as this is the number one way to prevent the spread of infection!  Home Care:  Only take medications as instructed by your medical team.  Complete the entire course of an antibiotic.  Do not take these medications with alcohol.  A steam or ultrasonic humidifier can help congestion.  You can place a towel over your head and breathe in the steam from hot water coming from a faucet.  Avoid close contacts especially the very young and the elderly.  Cover your mouth when you cough or sneeze.  Always remember to wash your hands.  Get Help Right Away If:  You develop worsening fever or sinus pain.  You develop a severe head ache or visual changes.  Your symptoms persist after you have completed your treatment plan.  Make sure you  Understand these instructions.  Will watch your condition.  Will get help right away if you are not doing  well or get worse.  Your e-visit answers were reviewed by a board certified advanced clinical practitioner to complete your personal care plan.  Depending on the condition, your plan could have included both over the counter or prescription medications.  If there is a problem please reply  once you have received a response from your provider.  Your safety is important to [place].  If you have drug allergies check your prescription carefully.    You can use MyChart to ask questions about today's visit, request a non-urgent call back, or ask for a work or school excuse for 24 hours related to this e-Visit. If it has been greater than 24 hours you will need to follow up with your provider, or enter a new e-Visit to address those concerns.  You will get an e-mail in the next two days asking about your experience.  I hope that your e-visit has been valuable and will speed your recovery. Thank you for using e-visits.

## 2017-07-02 ENCOUNTER — Encounter: Payer: Self-pay | Admitting: Nurse Practitioner

## 2017-07-30 ENCOUNTER — Ambulatory Visit: Payer: 59 | Admitting: Nurse Practitioner

## 2017-09-14 ENCOUNTER — Emergency Department
Admission: EM | Admit: 2017-09-14 | Discharge: 2017-09-14 | Disposition: A | Discharge status: Home / Self Care | Payer: 59 | Attending: Student in an Organized Health Care Education/Training Program | Admitting: Student in an Organized Health Care Education/Training Program

## 2017-09-14 ENCOUNTER — Emergency Department: Payer: 59

## 2017-09-14 ENCOUNTER — Other Ambulatory Visit: Payer: Self-pay

## 2017-09-14 ENCOUNTER — Encounter: Payer: Self-pay | Admitting: *Deleted

## 2017-09-14 DIAGNOSIS — R1031 Right lower quadrant pain: Secondary | ICD-10-CM | POA: Diagnosis present

## 2017-09-14 DIAGNOSIS — F17210 Nicotine dependence, cigarettes, uncomplicated: Secondary | ICD-10-CM | POA: Insufficient documentation

## 2017-09-14 DIAGNOSIS — Z79899 Other long term (current) drug therapy: Secondary | ICD-10-CM | POA: Insufficient documentation

## 2017-09-14 DIAGNOSIS — R300 Dysuria: Secondary | ICD-10-CM | POA: Insufficient documentation

## 2017-09-14 LAB — BASIC METABOLIC PANEL
Anion gap: 9 (ref 5–15)
BUN: 9 mg/dL (ref 6–20)
CALCIUM: 9.5 mg/dL (ref 8.9–10.3)
CHLORIDE: 105 mmol/L (ref 98–111)
CO2: 25 mmol/L (ref 22–32)
CREATININE: 0.93 mg/dL (ref 0.44–1.00)
GFR calc Af Amer: >60 mL/min (ref >60)
Glucose, Bld: 104 mg/dL — ABNORMAL HIGH (ref 70–99)
Potassium: 4 mmol/L (ref 3.5–5.1)
SODIUM: 139 mmol/L (ref 135–145)

## 2017-09-14 LAB — URINALYSIS, COMPLETE (UACMP) WITH MICROSCOPIC
Bilirubin Urine: NEGATIVE
Glucose, UA: NEGATIVE mg/dL
Ketones, ur: NEGATIVE mg/dL
LEUKOCYTES UA: NEGATIVE
Nitrite: NEGATIVE
PH: 5 (ref 5.0–8.0)
Protein, ur: NEGATIVE mg/dL
SPECIFIC GRAVITY, URINE: 1.013 (ref 1.005–1.030)

## 2017-09-14 LAB — CBC
HCT: 43.1 % (ref 35.0–47.0)
Hemoglobin: 14.9 g/dL (ref 12.0–16.0)
MCH: 31.3 pg (ref 26.0–34.0)
MCHC: 34.5 g/dL (ref 32.0–36.0)
MCV: 90.7 fL (ref 80.0–100.0)
PLATELETS: 244 10*3/uL (ref 150–440)
RBC: 4.76 MIL/uL (ref 3.80–5.20)
RDW: 13.9 % (ref 11.5–14.5)
WBC: 9.1 10*3/uL (ref 3.6–11.0)

## 2017-09-14 MED ORDER — SODIUM CHLORIDE 0.9 % IV BOLUS
1000.0000 mL | Freq: Once | INTRAVENOUS | Status: AC
  Administered 2017-09-14: 1000 mL via INTRAVENOUS

## 2017-09-14 MED ORDER — PROMETHAZINE HCL 25 MG/ML IJ SOLN
INTRAMUSCULAR | Status: AC
  Administered 2017-09-14: 12.5 mg via INTRAVENOUS
  Filled 2017-09-14: qty 1

## 2017-09-14 MED ORDER — CEPHALEXIN 500 MG PO CAPS
500.0000 mg | ORAL_CAPSULE | Freq: Once | ORAL | Status: AC
  Administered 2017-09-14: 500 mg via ORAL
  Filled 2017-09-14: qty 1

## 2017-09-14 MED ORDER — HYDROCODONE-ACETAMINOPHEN 5-325 MG PO TABS
1.0000 | ORAL_TABLET | Freq: Once | ORAL | Status: AC
  Administered 2017-09-14: 1 via ORAL
  Filled 2017-09-14: qty 1

## 2017-09-14 MED ORDER — KETOROLAC TROMETHAMINE 30 MG/ML IJ SOLN
INTRAMUSCULAR | Status: AC
  Administered 2017-09-14: 15 mg via INTRAVENOUS
  Filled 2017-09-14: qty 1

## 2017-09-14 MED ORDER — KETOROLAC TROMETHAMINE 30 MG/ML IJ SOLN
15.0000 mg | Freq: Once | INTRAMUSCULAR | Status: AC
  Administered 2017-09-14: 15 mg via INTRAVENOUS
  Filled 2017-09-14: qty 1

## 2017-09-14 MED ORDER — PROMETHAZINE HCL 12.5 MG PO TABS: 12.5000 mg | ORAL_TABLET | Freq: Four times a day (QID) | ORAL | 0 refills | Status: DC | PRN

## 2017-09-14 MED ORDER — FENTANYL CITRATE (PF) 100 MCG/2ML IJ SOLN
50.0000 ug | INTRAMUSCULAR | Status: DC | PRN
  Administered 2017-09-14: 50 ug via INTRAVENOUS
  Filled 2017-09-14: qty 2

## 2017-09-14 MED ORDER — ONDANSETRON HCL 4 MG/2ML IJ SOLN
4.0000 mg | Freq: Once | INTRAMUSCULAR | Status: AC
  Administered 2017-09-14: 4 mg via INTRAVENOUS

## 2017-09-14 MED ORDER — ONDANSETRON HCL 4 MG/2ML IJ SOLN
INTRAMUSCULAR | Status: AC
  Administered 2017-09-14: 4 mg via INTRAVENOUS
  Filled 2017-09-14: qty 2

## 2017-09-14 MED ORDER — IOHEXOL 300 MG/ML  SOLN
100.0000 mL | Freq: Once | INTRAMUSCULAR | Status: AC | PRN
  Administered 2017-09-14: 100 mL via INTRAVENOUS
  Filled 2017-09-14: qty 100

## 2017-09-14 MED ORDER — PROMETHAZINE HCL 25 MG/ML IJ SOLN
12.5000 mg | Freq: Once | INTRAMUSCULAR | Status: AC
  Administered 2017-09-14: 12.5 mg via INTRAVENOUS

## 2017-09-14 MED ORDER — HYDROCODONE-ACETAMINOPHEN 5-325 MG PO TABS: 1.0000 | ORAL_TABLET | ORAL | 0 refills | Status: DC | PRN

## 2017-09-14 MED ORDER — CEPHALEXIN 500 MG PO CAPS: 500.0000 mg | ORAL_CAPSULE | Freq: Three times a day (TID) | ORAL | 0 refills | Status: AC

## 2017-09-14 NOTE — ED Notes (Signed)
Pt gaging and reporting increased nausea at this time. MD aware.

## 2017-09-14 NOTE — ED Triage Notes (Signed)
Pt has right flank pain for 3 weeks.  Pt states pain is worse today.  Hx kidney stones.  Pt has nausea  Pt alert

## 2017-09-14 NOTE — ED Notes (Signed)
Patient transported to CT 

## 2017-09-14 NOTE — ED Notes (Signed)
Pt reports nausea is under control at this time. NAD noted.

## 2017-09-14 NOTE — Discharge Instructions (Addendum)

## 2017-09-14 NOTE — ED Provider Notes (Signed)
[facility] Emergency Department Provider Note    First MD Initiated Contact with Patient 09/14/17 1745     (approximate)  I have reviewed the triage vital signs and the nursing notes.   HISTORY  Chief Complaint Flank Pain    HPI Nichole Hall is a 52 y.o. female history of kidney stones presents to the ER with chief complaint of intermittent right flank pain for the past 3 weeks it became acutely worse with right lower quadrant pain and some dysuria.  Denies any fevers.  Says it feels similar to previous kidney stones but is more severe and discomfort.  Denies any pain radiating through to her back.  No shortness of breath or chest pain.  She denies any numbness or tingling.  No pain radiating down her leg.    Past Medical History:  Diagnosis Date  . Anxiety   . Osteopenia   . Wears contact lenses   . Wears dentures    partial upper   Family History  Problem Relation Age of Onset  . COPD Mother   . Arthritis/Rheumatoid Mother   . Hypertension Mother   . Anxiety disorder Sister   . Stroke Maternal Grandmother   . Cancer - Lung Maternal Grandfather   . Heart attack Maternal Grandfather    Past Surgical History:  Procedure Laterality Date  . ABDOMINAL HYSTERECTOMY    . CHOLECYSTECTOMY    . COLONOSCOPY WITH PROPOFOL N/A 12/24/2015   Procedure: COLONOSCOPY WITH PROPOFOL;  Surgeon: Lucilla Lame, MD;  Location: [place];  Service: Endoscopy;  Laterality: N/A;  . EYE SURGERY    . POLYPECTOMY  12/24/2015   Procedure: POLYPECTOMY;  Surgeon: Lucilla Lame, MD;  Location: [place];  Service: Endoscopy;;  . TUBAL LIGATION     Patient Active Problem List   Diagnosis Date Noted  . Arthritis 05/27/2017  . History of lumpectomy of left breast 05/27/2017  . Colonic constipation   . Polyp of sigmoid colon   . Rectal polyp   . Benign breast neoplasm 11/20/2014  . 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 Authorizing Provider  azithromycin (ZITHROMAX) 250 MG tablet 2 tabs today, then 1 tab daily x 4 more days. 06/30/17   Kennyth Arnold, FNP  diclofenac (VOLTAREN) 75 MG EC tablet Take 1 tablet (75 mg total) by mouth 2 (two) times daily. 05/27/17   Mikey College, NP  estradiol (VIVELLE-DOT) 0.05 MG/24HR patch Place onto the skin. 01/08/17   [provider]    Allergies Morphine; Morphine and related; and Penicillins    Social History Social History   Tobacco Use  . Smoking status: Current Every Day Smoker    Packs/day: 0.50    Years: 30.00    Pack years: 15.00    Types: Cigarettes  . Smokeless tobacco: Never Used  Substance Use Topics  . Alcohol use: Yes    Comment: 2-3 glasses wine/ month  . Drug use: No    Review of Systems Patient denies headaches, rhinorrhea, blurry vision, numbness, shortness of breath, chest pain, edema, cough, abdominal pain, nausea, vomiting, diarrhea, dysuria, fevers, rashes or hallucinations unless otherwise stated above in HPI. ____________________________________________   PHYSICAL EXAM:  VITAL SIGNS: Vitals:   09/14/17 1648  BP: 128/77  Pulse: 76  Resp: 20  Temp: 98.2 F (36.8 C)  SpO2: 95%    Constitutional: Alert and  oriented.  Eyes: Conjunctivae are normal.  Head: Atraumatic. Nose: No congestion/rhinnorhea. Mouth/Throat: Mucous membranes are moist.   Neck: No stridor. Painless ROM.  Cardiovascular: Normal rate, regular rhythm. Grossly normal heart sounds.  Good peripheral circulation. Respiratory: Normal respiratory effort.  No retractions. Lungs CTAB. Gastrointestinal: Soft with mild ttp in RLQ. No distention. No abdominal bruits. No CVA tenderness. Genitourinary: deferred Musculoskeletal: No lower extremity tenderness nor edema.  No joint effusions. Neurologic:  Normal speech and language. No gross focal neurologic deficits are appreciated. No  facial droop Skin:  Skin is warm, dry and intact. No rash noted. Psychiatric: Mood and affect are normal. Speech and behavior are normal.  ____________________________________________   LABS (all labs ordered are listed, but only abnormal results are displayed)  Results for orders placed or performed during the hospital encounter of 09/14/17 (from the past 24 hour(s))  Urinalysis, Complete w Microscopic     Status: Abnormal   Collection Time: 09/14/17  4:46 PM  Result Value Ref Range   Color, Urine YELLOW (A) YELLOW   APPearance CLEAR (A) CLEAR   Specific Gravity, Urine 1.013 1.005 - 1.030   pH 5.0 5.0 - 8.0   Glucose, UA NEGATIVE NEGATIVE mg/dL   Hgb urine dipstick MODERATE (A) NEGATIVE   Bilirubin Urine NEGATIVE NEGATIVE   Ketones, ur NEGATIVE NEGATIVE mg/dL   Protein, ur NEGATIVE NEGATIVE mg/dL   Nitrite NEGATIVE NEGATIVE   Leukocytes, UA NEGATIVE NEGATIVE   RBC / HPF 6-10 0 - 5 RBC/hpf   WBC, UA 0-5 0 - 5 WBC/hpf   Bacteria, UA MANY (A) NONE SEEN   Squamous Epithelial / LPF 0-5 0 - 5   Mucus PRESENT   Basic metabolic panel     Status: Abnormal   Collection Time: 09/14/17  4:49 PM  Result Value Ref Range   Sodium 139 135 - 145 mmol/L   Potassium 4.0 3.5 - 5.1 mmol/L   Chloride 105 98 - 111 mmol/L   CO2 25 22 - 32 mmol/L   Glucose, Bld 104 (H) 70 - 99 mg/dL   BUN 9 6 - 20 mg/dL   Creatinine, Ser 0.93 0.44 - 1.00 mg/dL   Calcium 9.5 8.9 - 10.3 mg/dL   GFR calc non Af Amer >60 >60 mL/min   GFR calc Af Amer >60 >60 mL/min   Anion gap 9 5 - 15  CBC     Status: None   Collection Time: 09/14/17  4:49 PM  Result Value Ref Range   WBC 9.1 3.6 - 11.0 K/uL   RBC 4.76 3.80 - 5.20 MIL/uL   Hemoglobin 14.9 12.0 - 16.0 g/dL   HCT 43.1 35.0 - 47.0 %   MCV 90.7 80.0 - 100.0 fL   MCH 31.3 26.0 - 34.0 pg   MCHC 34.5 32.0 - 36.0 g/dL   RDW 13.9 11.5 - 14.5 %   Platelets 244 150 - 440 K/uL    ____________________________________________ ____________________________________________  RADIOLOGY  I personally reviewed all radiographic images ordered to evaluate for the above acute complaints and reviewed radiology reports and findings.  These findings were personally discussed with the patient.  Please see medical record for radiology report.  ____________________________________________   PROCEDURES  Procedure(s) performed:  Procedures    Critical Care performed: no ____________________________________________   INITIAL IMPRESSION / ASSESSMENT AND PLAN / ED COURSE  Pertinent labs & imaging results that were available during my care of the patient were reviewed by me and considered in my medical decision making (see  chart for details).   DDX: appy, stone, uti, colic, colitis,  Sravya Danelle Earthly is a 52 y.o. who presents to the ED with symptoms as described above.  Blood work sent for the above differential was reassuring.  Does have bacteria and blood in her urine and based on her symptomatology with tenderness palpation right lower quadrant patient will receive IV fluids as well as IV pain medication IV antiemetics as well as CT imaging to evaluate for the above differential.  Clinical Course as of Sep 14 2033  Mon Sep 14, 2017  2034 Repeat abdominal exam is soft and benign.  Certainly possible small kidney stone or renal colic.  Patient will be given antibiotics for her bacteria in her urine.  Given lack of leukocytosis with normal CT imaging showing no evidence of acute appendicitis or other acute intra-abdominal process I do believe she is appropriate for trial of outpatient management.  Have discussed with the patient and available family all diagnostics and treatments performed thus far and all questions were answered to the best of my ability. The patient demonstrates understanding and agreement with plan.    [PR]    Clinical Course User Index [PR] Merlyn Lot, MD     As part of my medical decision making, I reviewed the following data within the [place] notes reviewed and incorporated, Labs reviewed, notes from prior ED visits and [place] Controlled Substance Database   ____________________________________________   FINAL CLINICAL IMPRESSION(S) / ED DIAGNOSES  Final diagnoses:  Right lower quadrant abdominal pain      NEW MEDICATIONS STARTED DURING THIS VISIT:  New Prescriptions   No medications on file     Note:  This document was prepared using Dragon voice recognition software and may include unintentional dictation errors.    Merlyn Lot, MD 09/14/17 2036

## 2017-11-10 ENCOUNTER — Encounter: Payer: Self-pay | Admitting: Nurse Practitioner

## 2018-01-25 ENCOUNTER — Emergency Department: Payer: 59

## 2018-01-25 ENCOUNTER — Ambulatory Visit (INDEPENDENT_AMBULATORY_CARE_PROVIDER_SITE_OTHER): Payer: 59 | Admitting: Nurse Practitioner

## 2018-01-25 ENCOUNTER — Encounter: Payer: Self-pay | Admitting: Emergency Medicine

## 2018-01-25 ENCOUNTER — Observation Stay: Payer: 59

## 2018-01-25 ENCOUNTER — Other Ambulatory Visit: Payer: Self-pay

## 2018-01-25 ENCOUNTER — Encounter: Payer: Self-pay | Admitting: Nurse Practitioner

## 2018-01-25 ENCOUNTER — Observation Stay
Admission: EM | Admit: 2018-01-25 | Discharge: 2018-01-26 | Disposition: A | Discharge status: Home / Self Care | Payer: 59 | Attending: Internal Medicine | Admitting: Internal Medicine

## 2018-01-25 DIAGNOSIS — Z9049 Acquired absence of other specified parts of digestive tract: Secondary | ICD-10-CM | POA: Diagnosis not present

## 2018-01-25 DIAGNOSIS — Z823 Family history of stroke: Secondary | ICD-10-CM | POA: Insufficient documentation

## 2018-01-25 DIAGNOSIS — F419 Anxiety disorder, unspecified: Secondary | ICD-10-CM | POA: Insufficient documentation

## 2018-01-25 DIAGNOSIS — Z86010 Personal history of colonic polyps: Secondary | ICD-10-CM | POA: Insufficient documentation

## 2018-01-25 DIAGNOSIS — R200 Anesthesia of skin: Secondary | ICD-10-CM | POA: Insufficient documentation

## 2018-01-25 DIAGNOSIS — Z801 Family history of malignant neoplasm of trachea, bronchus and lung: Secondary | ICD-10-CM | POA: Diagnosis not present

## 2018-01-25 DIAGNOSIS — Z818 Family history of other mental and behavioral disorders: Secondary | ICD-10-CM | POA: Diagnosis not present

## 2018-01-25 DIAGNOSIS — Z79899 Other long term (current) drug therapy: Secondary | ICD-10-CM | POA: Diagnosis not present

## 2018-01-25 DIAGNOSIS — I200 Unstable angina: Secondary | ICD-10-CM

## 2018-01-25 DIAGNOSIS — M8580 Other specified disorders of bone density and structure, unspecified site: Secondary | ICD-10-CM | POA: Insufficient documentation

## 2018-01-25 DIAGNOSIS — Z8249 Family history of ischemic heart disease and other diseases of the circulatory system: Secondary | ICD-10-CM | POA: Diagnosis not present

## 2018-01-25 DIAGNOSIS — Z90710 Acquired absence of both cervix and uterus: Secondary | ICD-10-CM | POA: Insufficient documentation

## 2018-01-25 DIAGNOSIS — Z880 Allergy status to penicillin: Secondary | ICD-10-CM | POA: Diagnosis not present

## 2018-01-25 DIAGNOSIS — Z8261 Family history of arthritis: Secondary | ICD-10-CM | POA: Diagnosis not present

## 2018-01-25 DIAGNOSIS — Z825 Family history of asthma and other chronic lower respiratory diseases: Secondary | ICD-10-CM | POA: Diagnosis not present

## 2018-01-25 DIAGNOSIS — R079 Chest pain, unspecified: Secondary | ICD-10-CM | POA: Diagnosis present

## 2018-01-25 DIAGNOSIS — Z23 Encounter for immunization: Secondary | ICD-10-CM | POA: Insufficient documentation

## 2018-01-25 DIAGNOSIS — F17210 Nicotine dependence, cigarettes, uncomplicated: Secondary | ICD-10-CM | POA: Diagnosis not present

## 2018-01-25 DIAGNOSIS — Z885 Allergy status to narcotic agent status: Secondary | ICD-10-CM | POA: Diagnosis not present

## 2018-01-25 DIAGNOSIS — R0602 Shortness of breath: Secondary | ICD-10-CM | POA: Diagnosis not present

## 2018-01-25 DIAGNOSIS — R0789 Other chest pain: Secondary | ICD-10-CM | POA: Diagnosis not present

## 2018-01-25 LAB — CBC
HEMATOCRIT: 43 % (ref 36.0–46.0)
Hemoglobin: 14.4 g/dL (ref 12.0–15.0)
MCH: 30.9 pg (ref 26.0–34.0)
MCHC: 33.5 g/dL (ref 30.0–36.0)
MCV: 92.3 fL (ref 80.0–100.0)
NRBC: 0 % (ref 0.0–0.2)
PLATELETS: 261 10*3/uL (ref 150–400)
RBC: 4.66 MIL/uL (ref 3.87–5.11)
RDW: 13.3 % (ref 11.5–15.5)
WBC: 9.4 10*3/uL (ref 4.0–10.5)

## 2018-01-25 LAB — BASIC METABOLIC PANEL
Anion gap: 9 (ref 5–15)
BUN: 9 mg/dL (ref 6–20)
CALCIUM: 9.2 mg/dL (ref 8.9–10.3)
CHLORIDE: 105 mmol/L (ref 98–111)
CO2: 25 mmol/L (ref 22–32)
CREATININE: 0.77 mg/dL (ref 0.44–1.00)
GFR calc non Af Amer: >60 mL/min (ref >60)
Glucose, Bld: 114 mg/dL — ABNORMAL HIGH (ref 70–99)
Potassium: 3.9 mmol/L (ref 3.5–5.1)
SODIUM: 139 mmol/L (ref 135–145)

## 2018-01-25 LAB — TROPONIN I
Troponin I: <0.03 ng/mL (ref <0.03)
Troponin I: <0.03 ng/mL (ref <0.03)
Troponin I: <0.03 ng/mL (ref <0.03)

## 2018-01-25 LAB — PROTIME-INR
INR: 0.93
PROTHROMBIN TIME: 12.4 s (ref 11.4–15.2)

## 2018-01-25 MED ORDER — KETOROLAC TROMETHAMINE 15 MG/ML IJ SOLN
15.0000 mg | Freq: Four times a day (QID) | INTRAMUSCULAR | Status: DC | PRN
  Administered 2018-01-25 – 2018-01-26 (×2): 15 mg via INTRAVENOUS
  Filled 2018-01-25 (×2): qty 1

## 2018-01-25 MED ORDER — NITROGLYCERIN 2 % TD OINT: 1.0000 [in_us] | TOPICAL_OINTMENT | Freq: Once | TRANSDERMAL | Status: DC

## 2018-01-25 MED ORDER — ONDANSETRON HCL 4 MG/2ML IJ SOLN
4.0000 mg | Freq: Four times a day (QID) | INTRAMUSCULAR | Status: DC | PRN
  Administered 2018-01-25: 4 mg via INTRAVENOUS
  Filled 2018-01-25: qty 2

## 2018-01-25 MED ORDER — INFLUENZA VAC SPLIT QUAD 0.5 ML IM SUSY
0.5000 mL | PREFILLED_SYRINGE | INTRAMUSCULAR | Status: AC
  Administered 2018-01-26: 0.5 mL via INTRAMUSCULAR
  Filled 2018-01-25: qty 0.5

## 2018-01-25 MED ORDER — NITROGLYCERIN 0.4 MG SL SUBL
0.4000 mg | SUBLINGUAL_TABLET | SUBLINGUAL | Status: DC | PRN
  Administered 2018-01-25: 0.4 mg via SUBLINGUAL
  Filled 2018-01-25: qty 1

## 2018-01-25 MED ORDER — NITROGLYCERIN 0.4 MG SL SUBL
0.4000 mg | SUBLINGUAL_TABLET | SUBLINGUAL | Status: DC | PRN
  Administered 2018-01-25: 0.4 mg via SUBLINGUAL

## 2018-01-25 MED ORDER — NITROGLYCERIN 0.4 MG SL SUBL: 0.4000 mg | SUBLINGUAL_TABLET | SUBLINGUAL | 3 refills | Status: DC | PRN

## 2018-01-25 MED ORDER — ENOXAPARIN SODIUM 40 MG/0.4ML ~~LOC~~ SOLN
40.0000 mg | SUBCUTANEOUS | Status: DC
  Administered 2018-01-25: 40 mg via SUBCUTANEOUS
  Filled 2018-01-25: qty 0.4

## 2018-01-25 MED ORDER — FAMOTIDINE IN NACL 20-0.9 MG/50ML-% IV SOLN
20.0000 mg | Freq: Two times a day (BID) | INTRAVENOUS | Status: DC
  Administered 2018-01-25 – 2018-01-26 (×2): 20 mg via INTRAVENOUS
  Filled 2018-01-25 (×2): qty 50

## 2018-01-25 MED ORDER — PNEUMOCOCCAL VAC POLYVALENT 25 MCG/0.5ML IJ INJ
0.5000 mL | INJECTION | INTRAMUSCULAR | Status: AC
  Administered 2018-01-26: 0.5 mL via INTRAMUSCULAR
  Filled 2018-01-25: qty 0.5

## 2018-01-25 MED ORDER — SODIUM CHLORIDE 0.9 % IV SOLN
INTRAVENOUS | Status: DC
  Administered 2018-01-25: 23:00:00 via INTRAVENOUS

## 2018-01-25 MED ORDER — IOHEXOL 350 MG/ML SOLN
75.0000 mL | Freq: Once | INTRAVENOUS | Status: AC | PRN
  Administered 2018-01-25: 75 mL via INTRAVENOUS

## 2018-01-25 NOTE — ED Triage Notes (Signed)
Patient presents to the ED with chest tightness and shortness of breath x 2 days.  Patient states she went to her PCP today who sent her to the ED stating, "they didn't like the look of my EKG."  Patient states EMS placed nitro patch on patient's chest.  Her PCP gave her 324mg  of aspirin and 1sl nitro.  Patient states the top right side of her lip has felt tingly since this am, not sure what time.

## 2018-01-25 NOTE — ED Notes (Signed)
Called and spoke with Remo Lipps and informed her that pt was coming up at this time. [facility] transporting pt

## 2018-01-25 NOTE — Progress Notes (Signed)
Patient c/o N/V and doesn't have any PRN medication for that. Dr. Leslye Peer notified and received a new order for Zofran  as indicated in the [facility]. Will administer and continue to monitor.

## 2018-01-25 NOTE — ED Notes (Signed)
Receiving RN states the patient can be transported to the floor at [facility]

## 2018-01-25 NOTE — ED Notes (Signed)
Pt given a blanket

## 2018-01-25 NOTE — ED Notes (Signed)
attemoted to call report, unable to give report at this time charge RN to call back

## 2018-01-25 NOTE — ED Notes (Signed)
Recollect troponin blood work sent to lab at this time

## 2018-01-25 NOTE — Progress Notes (Signed)
Pt. C/o chest pressure of 5/10 on a pain scale. NTG sub. Administered x 1 dose and she verbalized relief. Will continue to monitor.

## 2018-01-25 NOTE — Patient Instructions (Addendum)
Nichole Hall "[place]",   Thank you for coming in to clinic today.  1. Go to ER via EMS.  You may be having a heart attack and need urgent treatment.  Please schedule a follow-up appointment with Cassell Smiles, AGNP. Return if symptoms worsen or fail to improve AND after discharge.  If you have any other questions or concerns, please feel free to call the clinic or send a message through [place]. You may also schedule an earlier appointment if necessary.  You will receive a survey after today's visit either digitally by e-mail or paper by [employer]. Your experiences and feedback matter to [place].  Please respond so we know how we are doing as we provide care for you.   Cassell Smiles, DNP, AGNP-BC Adult Gerontology Nurse Practitioner [place]

## 2018-01-25 NOTE — H&P (Signed)
[place] [facility] [place] NAME: Nichole Hall    MR#:  [id number]  DATE OF BIRTH:  1965-10-24  DATE OF ADMISSION:  01/25/2018  PRIMARY CARE PHYSICIAN: Mikey College, NP   REQUESTING/REFERRING PHYSICIAN: Dr Larae Grooms  CHIEF COMPLAINT:   Chief Complaint  Patient presents with  . Chest Pain    HISTORY OF PRESENT ILLNESS:  Nichole Hall  is a 52 y.o. female presents with chest pain.  She states that today she is felt like an elephant on her chest.  Top of her lip is numb and tingling.  She is been short of breath and nauseated.  Yesterday she did feel a little bit.  She describes the pain as 5 out of 10 in intensity comes and goes.  She has a short 10-second pain that radiates up into her neck.  This can happen in any time.  No relation to food.  No complaints of acid reflux.  Hospitalist services were contacted for admission after 1 cardiac enzymes negative.  PAST MEDICAL HISTORY:   Past Medical History:  Diagnosis Date  . Anxiety   . Osteopenia   . Wears contact lenses   . Wears dentures    partial upper    PAST SURGICAL HISTORY:   Past Surgical History:  Procedure Laterality Date  . ABDOMINAL HYSTERECTOMY    . CHOLECYSTECTOMY    . COLONOSCOPY WITH PROPOFOL N/A 12/24/2015   Procedure: COLONOSCOPY WITH PROPOFOL;  Surgeon: Lucilla Lame, MD;  Location: [place];  Service: Endoscopy;  Laterality: N/A;  . EYE SURGERY    . POLYPECTOMY  12/24/2015   Procedure: POLYPECTOMY;  Surgeon: Lucilla Lame, MD;  Location: [place];  Service: Endoscopy;;  . TUBAL LIGATION      SOCIAL HISTORY:   Social History   Tobacco Use  . Smoking status: Current Every Day Smoker    Packs/day: 0.50    Years: 30.00    Pack years: 15.00    Types: Cigarettes  . Smokeless tobacco: Never Used  Substance Use Topics  . Alcohol use: Yes    Comment: 2-3 glasses wine/ month    FAMILY HISTORY:   Family History   Problem Relation Age of Onset  . COPD Mother   . Arthritis/Rheumatoid Mother   . Hypertension Mother   . Anxiety disorder Sister   . Stroke Maternal Grandmother   . Cancer - Lung Maternal Grandfather   . Heart attack Maternal Grandfather     DRUG ALLERGIES:   Allergies  Allergen Reactions  . Morphine Other (See Comments)    Dramatic drop in BP  . Morphine And Related Other (See Comments)    LOW BP   . Penicillins Rash    REVIEW OF SYSTEMS:  CONSTITUTIONAL: No fever, some chills.  Positive weight loss.  Positive for fatigue.  EYES: Some fuzzy vision bilaterally.  Wears glasses. EARS, NOSE, AND THROAT: Positive for ringing in the ears.. No sore throat RESPIRATORY: No cough.  Positive for shortness of breath.  No wheezing or hemoptysis.  CARDIOVASCULAR: Positive for chest pain, no orthopnea, edema.  GASTROINTESTINAL: Positive for nausea.  No vomiting, diarrhea or abdominal pain. No blood in bowel movements GENITOURINARY: No dysuria, hematuria.  ENDOCRINE: No polyuria, nocturia,  HEMATOLOGY: No anemia, easy bruising or bleeding SKIN: No rash or lesion. MUSCULOSKELETAL: Some neck pain.  Some arthritis NEUROLOGIC: No tingling, numbness, weakness.  PSYCHIATRY: History of anxiety.   MEDICATIONS AT HOME:   Prior to Admission  medications   Medication Sig Start Date End Date Taking? Authorizing Provider  estradiol (VIVELLE-DOT) 0.05 MG/24HR patch Place onto the skin. 01/08/17   [provider]  nitroGLYCERIN (NITROSTAT) 0.4 MG SL tablet Place 1 tablet (0.4 mg total) under the tongue every 5 (five) minutes as needed for chest pain. 01/25/18   Mikey College, NP   Medication reconciliation still undergoing.  VITAL SIGNS:  Blood pressure 100/76, pulse (!) 57, temperature 98.4 F (36.9 C), temperature source Oral, resp. rate 16, height 5\' 9"  (1.753 m), weight 86.2 kg, SpO2 98 %.  PHYSICAL EXAMINATION:  GENERAL:  52 y.o.-year-old patient lying in the bed with no  acute distress.  EYES: Pupils equal, round, reactive to light and accommodation. No scleral icterus. Extraocular muscles intact.  HEENT: Head atraumatic, normocephalic. Oropharynx and nasopharynx clear.  NECK:  Supple, no jugular venous distention. No thyroid enlargement, no tenderness.  LUNGS: Normal breath sounds bilaterally, no wheezing, rales,rhonchi or crepitation. No use of accessory muscles of respiration.  CARDIOVASCULAR: S1, S2 normal. No murmurs, rubs, or gallops.  ABDOMEN: Soft, nontender, nondistended. Bowel sounds present. No organomegaly or mass.  EXTREMITIES: No pedal edema, cyanosis, or clubbing.  NEUROLOGIC: Cranial nerves II through XII are intact. Muscle strength 5/5 in all extremities. Sensation intact. Gait not checked.  PSYCHIATRIC: The patient is alert and oriented x 3.  SKIN: No rash, lesion, or ulcer.   LABORATORY PANEL:   CBC Recent Labs  Lab 01/25/18 1255  WBC 9.4  HGB 14.4  HCT 43.0  PLT 261   ------------------------------------------------------------------------------------------------------------------  Chemistries  Recent Labs  Lab 01/25/18 1255  NA 139  K 3.9  CL 105  CO2 25  GLUCOSE 114*  BUN 9  CREATININE 0.77  CALCIUM 9.2   ------------------------------------------------------------------------------------------------------------------  Cardiac Enzymes Recent Labs  Lab 01/25/18 1719  TROPONINI <0.03   ------------------------------------------------------------------------------------------------------------------  RADIOLOGY:  Dg Chest 2 View  Result Date: 01/25/2018 CLINICAL DATA:  Initial evaluation for acute chest pain, palpitations. EXAM: CHEST - 2 VIEW COMPARISON:  Prior radiograph from 01/27/2015. FINDINGS: Cardiac and mediastinal silhouettes are stable in size and contour, and remain within normal limits. Lungs mildly hypoinflated. No focal infiltrates. No pulmonary edema or pleural effusion. No pneumothorax. Surgical  clips overlie the lower left chest. No acute osseous abnormality. IMPRESSION: No radiographic evidence for active cardiopulmonary disease. Electronically Signed   By: Jeannine Boga M.D.   On: 01/25/2018 13:30    EKG:   Normal sinus rhythm 61 bpm.  Sinus arrhythmia.  Flipped T waves V1 through V3  IMPRESSION AND PLAN:   1.  Chest pain and shortness of breath.  Second troponin just came back negative.  We will get a third troponin in about 4 hours and if that is negative we will get a stress test tomorrow morning.  We will get a CT scan of the chest to rule out pulmonary embolism now. 2.  Tobacco abuse smoking cessation counseling done 4 minutes by me.  Nicotine patch refused.   All the records are reviewed and case discussed with ED provider. Management plans discussed with the patient, family and they are in agreement.  CODE STATUS: full code  TOTAL TIME TAKING CARE OF THIS PATIENT: 50 minutes.    Loletha Grayer M.D on 01/25/2018 at 6:29 PM  Between 7am to 6pm - Pager - [phone]  After 6pm call admission pager [phone]  [facility] Office  [phone]  CC: Primary care physician; Mikey College, NP

## 2018-01-25 NOTE — ED Triage Notes (Signed)
Pt in via EMS from MD office in [place] with c/p starting yesterday, describes as heaviness and radiates into her neck. Pt received 1 nitro at MD office, EMS applied nitro paste and administered 4 baby asa. #18 in left AC.

## 2018-01-25 NOTE — Progress Notes (Signed)
Subjective:    Patient ID: Nichole Hall, female    DOB: 06-30-1965, 52 y.o.   MRN: [id number]  Nichole Hall is a 52 y.o. female presenting on 01/25/2018 for Palpitations (heart palpations, SOB, nauseated, tingling lips  and tightiness in chest x 2 days )   HPI Patient presents to office for workup of palpitations.  She also has symptoms of shortness of breath, chest tightness, upper R lip tingling, nausea, Left sided neck pain, fuzzy vision, malaise, and sensation that "something just isn't right".  She states, "I'm not even sure how I was able to drive over here." - She has no cardiac history  Social History   Tobacco Use  . Smoking status: Current Every Day Smoker    Packs/day: 0.50    Years: 30.00    Pack years: 15.00    Types: Cigarettes  . Smokeless tobacco: Never Used  Substance Use Topics  . Alcohol use: Yes    Comment: 2-3 glasses wine/ month  . Drug use: No    Review of Systems Per HPI unless specifically indicated above     Objective:    There were no vitals taken for this visit.  Wt Readings from Last 3 Encounters:  09/14/17 175 lb (79.4 kg)  05/27/17 186 lb (84.4 kg)  12/24/15 176 lb (79.8 kg)    Physical Exam  Constitutional: She is oriented to person, place, and time. She appears well-developed and well-nourished. No distress.  HENT:  Head: Normocephalic and atraumatic.  Neck: Normal range of motion. Neck supple.  Cardiovascular: Normal rate, regular rhythm, S1 normal, S2 normal and intact distal pulses.  Murmur (grade 1/6 holosystolic murmur) heard. Pulmonary/Chest: Effort normal and breath sounds normal. No respiratory distress.  Musculoskeletal: She exhibits no edema (pedal).  Neurological: She is alert and oriented to person, place, and time.  Skin: Skin is warm and dry. Capillary refill takes less than 2 seconds.  Psychiatric: Her speech is normal and behavior is normal. Her mood appears anxious.  Vitals reviewed.   01/25/18 EKG: Sinus  bradycardia with 1 mm ST depression in Leads V2, V3, V4   HR 59bpm   Results for orders placed or performed during the hospital encounter of 37/85/88  Basic metabolic panel  Result Value Ref Range   Sodium 139 135 - 145 mmol/L   Potassium 4.0 3.5 - 5.1 mmol/L   Chloride 105 98 - 111 mmol/L   CO2 25 22 - 32 mmol/L   Glucose, Bld 104 (H) 70 - 99 mg/dL   BUN 9 6 - 20 mg/dL   Creatinine, Ser 0.93 0.44 - 1.00 mg/dL   Calcium 9.5 8.9 - 10.3 mg/dL   GFR calc non Af Amer >60 >60 mL/min   GFR calc Af Amer >60 >60 mL/min   Anion gap 9 5 - 15  CBC  Result Value Ref Range   WBC 9.1 3.6 - 11.0 K/uL   RBC 4.76 3.80 - 5.20 MIL/uL   Hemoglobin 14.9 12.0 - 16.0 g/dL   HCT 43.1 35.0 - 47.0 %   MCV 90.7 80.0 - 100.0 fL   MCH 31.3 26.0 - 34.0 pg   MCHC 34.5 32.0 - 36.0 g/dL   RDW 13.9 11.5 - 14.5 %   Platelets 244 150 - 440 K/uL  Urinalysis, Complete w Microscopic  Result Value Ref Range   Color, Urine YELLOW (A) YELLOW   APPearance CLEAR (A) CLEAR   Specific Gravity, Urine 1.013 1.005 - 1.030   pH 5.0 5.0 -  8.0   Glucose, UA NEGATIVE NEGATIVE mg/dL   Hgb urine dipstick MODERATE (A) NEGATIVE   Bilirubin Urine NEGATIVE NEGATIVE   Ketones, ur NEGATIVE NEGATIVE mg/dL   Protein, ur NEGATIVE NEGATIVE mg/dL   Nitrite NEGATIVE NEGATIVE   Leukocytes, UA NEGATIVE NEGATIVE   RBC / HPF 6-10 0 - 5 RBC/hpf   WBC, UA 0-5 0 - 5 WBC/hpf   Bacteria, UA MANY (A) NONE SEEN   Squamous Epithelial / LPF 0-5 0 - 5   Mucus PRESENT       Assessment & Plan:   Problem List Items Addressed This Visit    None    Visit Diagnoses    Unstable angina pectoris (HCC)    -  Primary   Relevant Medications   nitroGLYCERIN (NITROSTAT) 0.4 MG SL tablet   Other Relevant Orders   EKG 12-Lead      Meds ordered this encounter  Medications  . nitroGLYCERIN (NITROSTAT) 0.4 MG SL tablet    Sig: Place 1 tablet (0.4 mg total) under the tongue every 5 (five) minutes as needed for chest pain.    Dispense:  50 tablet     Refill:  3    Order Specific Question:   Supervising Provider    Answer:   Olin Hauser [[id number]]  # Unstable angina Patient with symptoms consistent with angina x 2 days.  Ongoing symptoms in clinic. - Administered one SL nitro tab - no significant relief - Started on 2L O2 with some improvement of shortness of breath. - Called EMS for transportation to ER given current symptoms and EKG. - EKG - V2, V3, V4 with ST depression - seems consistent with last EKG on 01/28/2015 and may not be new after review of old EKG once patient has left clinic.  Follow up plan: Return if symptoms worsen or fail to improve AND after discharge.  Cassell Smiles, DNP, AGPCNP-BC Adult Gerontology Primary Care Nurse Practitioner [facility] 01/25/2018, 11:43 AM

## 2018-01-25 NOTE — ED Provider Notes (Signed)
[facility] Emergency Department Provider Note  ___________________________________________   First MD Initiated Contact with Patient 01/25/18 1703     (approximate)  I have reviewed the triage vital signs and the nursing notes.   HISTORY  Chief Complaint Chest Pain   HPI Aslynn Danelle Earthly is a 52 y.o. female with a history of anxiety who smokes approximately a pack of cigarettes per day who was presented to the emergency department with central chest pressure.  Says the pain is been ongoing over the past 2 to 3 days and radiates through to the back of her neck as well as an associated numbness to her upper lip.  Says the chest pain worsens when she exerts himself such as going up stairs.  Says that she is also been nauseous.  No history of coronary artery disease.  Patient reports that she was seen at her primary care doctors earlier in the day and given a nitro tab which resolved the pain.  Given Nitropaste as well but the paste fell off and she says the pressure started to return at this time.  Also took aspirin earlier in the day.   Past Medical History:  Diagnosis Date  . Anxiety   . Osteopenia   . Wears contact lenses   . Wears dentures    partial upper    Patient Active Problem List   Diagnosis Date Noted  . Arthritis 05/27/2017  . History of lumpectomy of left breast 05/27/2017  . Colonic constipation   . Polyp of sigmoid colon   . Rectal polyp   . Benign breast neoplasm 11/20/2014  . 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 ABDOMINAL HYSTERECTOMY    . CHOLECYSTECTOMY    . COLONOSCOPY WITH PROPOFOL N/A 12/24/2015   Procedure: COLONOSCOPY WITH PROPOFOL;  Surgeon: Lucilla Lame, MD;  Location: [place];  Service: Endoscopy;  Laterality: N/A;  . EYE SURGERY    . POLYPECTOMY  12/24/2015   Procedure: POLYPECTOMY;  Surgeon: Lucilla Lame, MD;  Location: [place];   Service: Endoscopy;;  . TUBAL LIGATION      Prior to Admission medications   Medication Sig Start Date End Date Taking? Authorizing Provider  azithromycin (ZITHROMAX) 250 MG tablet 2 tabs today, then 1 tab daily x 4 more days. Patient not taking: Reported on 01/25/2018 06/30/17   Kennyth Arnold, FNP  diclofenac (VOLTAREN) 75 MG EC tablet Take 1 tablet (75 mg total) by mouth 2 (two) times daily. Patient not taking: Reported on 01/25/2018 05/27/17   Mikey College, NP  estradiol (VIVELLE-DOT) 0.05 MG/24HR patch Place onto the skin. 01/08/17   [provider]  HYDROcodone-acetaminophen (NORCO) 5-325 MG tablet Take 1 tablet by mouth every 4 (four) hours as needed for moderate pain. Patient not taking: Reported on 01/25/2018 09/14/17   Merlyn Lot, MD  nitroGLYCERIN (NITROSTAT) 0.4 MG SL tablet Place 1 tablet (0.4 mg total) under the tongue every 5 (five) minutes as needed for chest pain. 01/25/18   Mikey College, NP  promethazine (PHENERGAN) 12.5 MG tablet Take 1 tablet (12.5 mg total) by mouth every 6 (six) hours as needed for nausea or vomiting. Patient not taking: Reported on 01/25/2018 09/14/17   Merlyn Lot, MD    Allergies Morphine; Morphine and related; and Penicillins  Family History  Problem Relation Age of Onset  . COPD Mother   . Arthritis/Rheumatoid Mother   . Hypertension Mother   .  Anxiety disorder Sister   . Stroke Maternal Grandmother   . Cancer - Lung Maternal Grandfather   . Heart attack Maternal Grandfather     Social History Social History   Tobacco Use  . Smoking status: Current Every Day Smoker    Packs/day: 0.50    Years: 30.00    Pack years: 15.00    Types: Cigarettes  . Smokeless tobacco: Never Used  Substance Use Topics  . Alcohol use: Yes    Comment: 2-3 glasses wine/ month  . Drug use: No    Review of Systems  Constitutional: No fever/chills Eyes: No visual changes. ENT: No sore throat. Cardiovascular: As  above Respiratory: As above Gastrointestinal: No abdominal pain.  no vomiting.  No diarrhea.  No constipation. Genitourinary: Negative for dysuria. Musculoskeletal: Negative for back pain. Skin: Negative for rash. Neurological: Negative for headaches, focal weakness or numbness.   ____________________________________________   PHYSICAL EXAM:  VITAL SIGNS: ED Triage Vitals  Enc Vitals Group     BP 01/25/18 1247 (!) 137/120     Pulse Rate 01/25/18 1247 61     Resp 01/25/18 1247 18     Temp 01/25/18 1247 98.4 F (36.9 C)     Temp Source 01/25/18 1247 Oral     SpO2 01/25/18 1247 100 %     Weight 01/25/18 1248 190 lb (86.2 kg)     Height 01/25/18 1248 5\' 9"  (1.753 m)     Head Circumference --      Peak Flow --      Pain Score 01/25/18 1247 5     Pain Loc --      Pain Edu? --      Excl. in [place]? --     Constitutional: Alert and oriented. Well appearing and in no acute distress. Eyes: Conjunctivae are normal.  Head: Atraumatic. Nose: No congestion/rhinnorhea. Mouth/Throat: Mucous membranes are moist.  Neck: No stridor.   Cardiovascular: Normal rate, regular rhythm. Grossly normal heart sounds.  Bilateral radial pulses are present and equal. Respiratory: Normal respiratory effort.  No retractions. Lungs CTAB. Gastrointestinal: Soft and nontender. No distention. Musculoskeletal: No lower extremity tenderness nor edema.  No joint effusions. Neurologic:  Normal speech and language. No gross focal neurologic deficits are appreciated. Skin:  Skin is warm, dry and intact. No rash noted. Psychiatric: Mood and affect are normal. Speech and behavior are normal.  ____________________________________________   LABS (all labs ordered are listed, but only abnormal results are displayed)  Labs Reviewed  BASIC METABOLIC PANEL - Abnormal; Notable for the following components:      Result Value   Glucose, Bld 114 (*)    All other components within normal limits  CBC  TROPONIN I   PROTIME-INR  TROPONIN I   ____________________________________________  EKG  ED ECG REPORT I, Doran Stabler, the attending physician, personally viewed and interpreted this ECG.   Date: 01/25/2018  EKG Time: 1243  Rate: 61  Rhythm: normal sinus rhythm  Axis: Normal  Intervals:none  ST&T Change: No ST segment elevation or depression.  T wave inversions in V2 and V3.  Unchanged from previous.  ____________________________________________  RADIOLOGY  Chest x-ray without acute process. ____________________________________________   PROCEDURES  Procedure(s) performed:   Procedures  Critical Care performed:   ____________________________________________   INITIAL IMPRESSION / ASSESSMENT AND PLAN / ED COURSE  Pertinent labs & imaging results that were available during my care of the patient were reviewed by me and considered in my medical decision making (  see chart for details).  Differential diagnosis includes, but is not limited to, ACS, aortic dissection, pulmonary embolism, cardiac tamponade, pneumothorax, pneumonia, pericarditis, myocarditis, GI-related causes including esophagitis/gastritis, and musculoskeletal chest wall pain.   As part of my medical decision making, I reviewed the following data within the electronic MEDICAL RECORD NUMBER Notes from prior ED visits  ----------------------------------------- 5:54 PM on 01/25/2018 -----------------------------------------  Patient to be admitted to the hospital.  High risk features for unstable angina.  Patient 52 years old, cigarette smoker.  Patient understanding of the diagnosis as well as treatment and willing to comply.  Signed out to Dr. Earleen Newport.  ____________________________________________   FINAL CLINICAL IMPRESSION(S) / ED DIAGNOSES  Chest pain.  NEW MEDICATIONS STARTED DURING THIS VISIT:  New Prescriptions   No medications on file     Note:  This document was prepared using Dragon voice  recognition software and may include unintentional dictation errors.     Orbie Pyo, MD 01/25/18 1754

## 2018-01-25 NOTE — ED Notes (Signed)
Pt BP 100/76. NTG paste held and [place] notified.

## 2018-01-26 LAB — BASIC METABOLIC PANEL
Anion gap: 5 (ref 5–15)
BUN: 10 mg/dL (ref 6–20)
CALCIUM: 8.4 mg/dL — AB (ref 8.9–10.3)
CO2: 28 mmol/L (ref 22–32)
Chloride: 106 mmol/L (ref 98–111)
Creatinine, Ser: 0.95 mg/dL (ref 0.44–1.00)
GFR calc non Af Amer: >60 mL/min (ref >60)
GLUCOSE: 106 mg/dL — AB (ref 70–99)
Potassium: 3.9 mmol/L (ref 3.5–5.1)
Sodium: 139 mmol/L (ref 135–145)

## 2018-01-26 LAB — LIPID PANEL
CHOLESTEROL: 172 mg/dL (ref 0–200)
HDL: 33 mg/dL — ABNORMAL LOW (ref >40)
LDL CALC: 101 mg/dL — AB (ref 0–99)
Total CHOL/HDL Ratio: 5.2 RATIO
Triglycerides: 188 mg/dL — ABNORMAL HIGH (ref <150)
VLDL: 38 mg/dL (ref 0–40)

## 2018-01-26 LAB — NM MYOCAR MULTI W/SPECT W/WALL MOTION / EF
CHL CUP NUCLEAR SDS: 0
CHL CUP NUCLEAR SRS: 8
LV sys vol: 22 mL
LVDIAVOL: 77 mL (ref 46–106)
SSS: 1
TID: 1.07

## 2018-01-26 LAB — CBC
HCT: 41.3 % (ref 36.0–46.0)
Hemoglobin: 13.3 g/dL (ref 12.0–15.0)
MCH: 30.6 pg (ref 26.0–34.0)
MCHC: 32.2 g/dL (ref 30.0–36.0)
MCV: 94.9 fL (ref 80.0–100.0)
PLATELETS: 214 10*3/uL (ref 150–400)
RBC: 4.35 MIL/uL (ref 3.87–5.11)
RDW: 13.2 % (ref 11.5–15.5)
WBC: 7.9 10*3/uL (ref 4.0–10.5)
nRBC: 0 % (ref 0.0–0.2)

## 2018-01-26 MED ORDER — TECHNETIUM TC 99M TETROFOSMIN IV KIT
33.0000 | PACK | Freq: Once | INTRAVENOUS | Status: AC | PRN
  Administered 2018-01-26: 33 via INTRAVENOUS

## 2018-01-26 MED ORDER — TECHNETIUM TC 99M TETROFOSMIN IV KIT
10.7800 | PACK | Freq: Once | INTRAVENOUS | Status: AC | PRN
  Administered 2018-01-26: 10.78 via INTRAVENOUS

## 2018-01-26 MED ORDER — NICOTINE 21 MG/24HR TD PT24: 21.0000 mg | MEDICATED_PATCH | Freq: Every day | TRANSDERMAL | Status: DC

## 2018-01-26 MED ORDER — REGADENOSON 0.4 MG/5ML IV SOLN
0.4000 mg | Freq: Once | INTRAVENOUS | Status: AC
  Administered 2018-01-26: 0.4 mg via INTRAVENOUS

## 2018-01-26 NOTE — Progress Notes (Signed)
ptto be discharged this pm. Iv and tele removed. disch instructions given to pt to her understanding. discha pending ride.

## 2018-01-26 NOTE — Progress Notes (Signed)
[facility] [place] NAME: Pahoua Schreiner    MR#:  [id number]  DATE OF BIRTH:  [birth year]  SUBJECTIVE:  CHIEF COMPLAINT:   Chief Complaint  Patient presents with  . Chest Pain  Patient seen and evaluated today No new episodes of chest pain No shortness of breath No palpitations CTA chest showed no pulmonary embolism  REVIEW OF SYSTEMS:    ROS  CONSTITUTIONAL: No documented fever. No fatigue, weakness. No weight gain, no weight loss.  EYES: No blurry or double vision.  ENT: No tinnitus. No postnasal drip. No redness of the oropharynx.  RESPIRATORY: No cough, no wheeze, no hemoptysis. No dyspnea.  CARDIOVASCULAR: No chest pain. No orthopnea. No palpitations. No syncope.  GASTROINTESTINAL: No nausea, no vomiting or diarrhea. No abdominal pain. No melena or hematochezia.  GENITOURINARY: No dysuria or hematuria.  ENDOCRINE: No polyuria or nocturia. No heat or cold intolerance.  HEMATOLOGY: No anemia. No bruising. No bleeding.  INTEGUMENTARY: No rashes. No lesions.  MUSCULOSKELETAL: No arthritis. No swelling. No gout.  NEUROLOGIC: No numbness, tingling, or ataxia. No seizure-type activity.  PSYCHIATRIC: No anxiety. No insomnia. No ADD.   DRUG ALLERGIES:   Allergies  Allergen Reactions  . Morphine Other (See Comments)    Dramatic drop in BP  . Morphine And Related Other (See Comments)    LOW BP   . Penicillins Rash    Has patient had a PCN reaction causing immediate rash, facial/tongue/throat swelling, SOB or lightheadedness with hypotension: Yes Has patient had a PCN reaction causing severe rash involving mucus membranes or skin necrosis: No Has patient had a PCN reaction that required hospitalization: No Has patient had a PCN reaction occurring within the last 10 years: No If all of the above answers are "NO", then may proceed with Cephalosporin use.    VITALS:  Blood pressure 105/66, pulse (!) 57, temperature 97.8 F (36.6 C),  temperature source Oral, resp. rate 16, height 5\' 9"  (1.753 m), weight 85.5 kg, SpO2 98 %.  PHYSICAL EXAMINATION:   Physical Exam  GENERAL:  52 y.o.-year-old patient lying in the bed with no acute distress.  EYES: Pupils equal, round, reactive to light and accommodation. No scleral icterus. Extraocular muscles intact.  HEENT: Head atraumatic, normocephalic. Oropharynx and nasopharynx clear.  NECK:  Supple, no jugular venous distention. No thyroid enlargement, no tenderness.  LUNGS: Normal breath sounds bilaterally, no wheezing, rales, rhonchi. No use of accessory muscles of respiration.  CARDIOVASCULAR: S1, S2 normal. No murmurs, rubs, or gallops.  ABDOMEN: Soft, nontender, nondistended. Bowel sounds present. No organomegaly or mass.  EXTREMITIES: No cyanosis, clubbing or edema b/l.    NEUROLOGIC: Cranial nerves II through XII are intact. No focal Motor or sensory deficits b/l.   PSYCHIATRIC: The patient is alert and oriented x 3.  SKIN: No obvious rash, lesion, or ulcer.   LABORATORY PANEL:   CBC Recent Labs  Lab 01/26/18 0526  WBC 7.9  HGB 13.3  HCT 41.3  PLT 214   ------------------------------------------------------------------------------------------------------------------ Chemistries  Recent Labs  Lab 01/26/18 0526  NA 139  K 3.9  CL 106  CO2 28  GLUCOSE 106*  BUN 10  CREATININE 0.95  CALCIUM 8.4*   ------------------------------------------------------------------------------------------------------------------  Cardiac Enzymes Recent Labs  Lab 01/25/18 2229  TROPONINI <0.03   ------------------------------------------------------------------------------------------------------------------  RADIOLOGY:  Dg Chest 2 View  Result Date: 01/25/2018 CLINICAL DATA:  Initial evaluation for acute chest pain, palpitations. EXAM: CHEST - 2 VIEW COMPARISON:  Prior radiograph  from 01/27/2015. FINDINGS: Cardiac and mediastinal silhouettes are stable in size and  contour, and remain within normal limits. Lungs mildly hypoinflated. No focal infiltrates. No pulmonary edema or pleural effusion. No pneumothorax. Surgical clips overlie the lower left chest. No acute osseous abnormality. IMPRESSION: No radiographic evidence for active cardiopulmonary disease. Electronically Signed   By: Jeannine Boga M.D.   On: 01/25/2018 13:30   Ct Angio Chest Pe W Or Wo Contrast  Result Date: 01/25/2018 CLINICAL DATA:  52 year old female with chest pain shortness of breath and nausea. EXAM: CT ANGIOGRAPHY CHEST WITH CONTRAST TECHNIQUE: Multidetector CT imaging of the chest was performed using the standard protocol during bolus administration of intravenous contrast. Multiplanar CT image reconstructions and MIPs were obtained to evaluate the vascular anatomy. CONTRAST:  77mL OMNIPAQUE IOHEXOL 350 MG/ML SOLN COMPARISON:  Radiographs 1314 hours today. Chest CT 12/07/2010. FINDINGS: Cardiovascular: Excellent contrast bolus timing in the pulmonary arterial tree. No focal filling defect identified in the pulmonary arteries to suggest acute pulmonary embolism. No calcified coronary artery atherosclerosis is evident. Cardiac size is at the upper limits of normal. No pericardial effusion. There is little contrast in the aorta today, no thoracic aorta abnormality is evident. Mediastinum/Nodes: Negative. No lymphadenopathy. Incidental small left breast surgical clips. Negative thoracic inlet. Lungs/Pleura: Major airways are patent. Lower lung volumes compared to 2012 with mild ground-glass opacity in both lungs with a dependent gradient favored due to atelectasis. No pleural effusion. Unchanged small nodule in the left lateral costophrenic angle (benign). No other abnormal pulmonary opacity. Upper Abdomen: Negative visible liver, spleen and stomach. Musculoskeletal: Negative. Review of the MIP images confirms the above findings. IMPRESSION: 1. Negative for acute pulmonary embolus. 2.  Atelectasis, no other acute findings in the chest. Electronically Signed   By: Genevie Ann M.D.   On: 01/25/2018 19:26   Nm Myocar Multi W/spect W/wall Motion / Ef  Result Date: 01/26/2018  Findings consistent with prior myocardial infarction with peri-infarct ischemia.  This is a low risk study.  The left ventricular ejection fraction is mildly decreased (45-54%).  Blood pressure demonstrated a normal response to exercise.  There was no ST segment deviation noted during stress.      ASSESSMENT AND PLAN:  52 year old female patient with history of anxiety disorder presented to the emergency room for chest pain  -Chest pain Serial troponin negative Cardiac stress test today CTA chest showed no pulmonary embolism  -DVT prophylaxis subcu Lovenox daily  -Tobacco abuse Tobacco cessation counseled to the patient Nicotine patch offered   All the records are reviewed and case discussed with Care Management/Social Worker. Management plans discussed with the patient, family and they are in agreement.  CODE STATUS: Full code  DVT Prophylaxis: SCDs  TOTAL TIME TAKING CARE OF THIS PATIENT: 33 minutes.   POSSIBLE D/C IN 1 DAYS, DEPENDING ON CLINICAL CONDITION.  Saundra Shelling M.D on 01/26/2018 at 2:57 PM  Between 7am to 6pm - Pager - [phone]  After 6pm go to [email or web address] - password [email or web address] [facility]  Office  [phone]  CC: Primary care physician; Mikey College, NP  Note: This dictation was prepared with Dragon dictation along with smaller phrase technology. Any transcriptional errors that result from this process are unintentional.

## 2018-01-26 NOTE — Discharge Summary (Signed)
[facility] [place] NAME: Nichole Hall    MR#:  [id number]  DATE OF BIRTH:  [birth year]  DATE OF ADMISSION:  01/25/2018 ADMITTING PHYSICIAN: Loletha Grayer, MD  DATE OF DISCHARGE: No discharge date for patient encounter.  PRIMARY CARE PHYSICIAN: Mikey College, NP   ADMISSION DIAGNOSIS:  Unstable angina ([place]) [I20.0]  DISCHARGE DIAGNOSIS:  Atypical chest pain musculoskeletal in etiology Tobacco abuse  SECONDARY DIAGNOSIS:   Past Medical History:  Diagnosis Date  . Anxiety   . Osteopenia   . Wears contact lenses   . Wears dentures    partial upper     ADMITTING HISTORY Nichole Hall  is a 52 y.o. female presents with chest pain.  She states that today she is felt like an elephant on her chest.  Top of her lip is numb and tingling.  She is been short of breath and nauseated.  Yesterday she did feel a little bit.  She describes the pain as 5 out of 10 in intensity comes and goes.  She has a short 10-second pain that radiates up into her neck.  This can happen in any time.  No relation to food.  No complaints of acid reflux.  Hospitalist services were contacted for admission after 1 cardiac enzymes negative.   HOSPITAL COURSE:  Patient was admitted to telemetry.  Serial troponins were negative and she was worked up with cardiac stress test.  No evidence of any ischemia on stress test.  Chest pain completely resolved.  CONSULTS OBTAINED:    DRUG ALLERGIES:   Allergies  Allergen Reactions  . Morphine Other (See Comments)    Dramatic drop in BP  . Morphine And Related Other (See Comments)    LOW BP   . Penicillins Rash    Has patient had a PCN reaction causing immediate rash, facial/tongue/throat swelling, SOB or lightheadedness with hypotension: Yes Has patient had a PCN reaction causing severe rash involving mucus membranes or skin necrosis: No Has patient had a PCN reaction that required hospitalization: No Has  patient had a PCN reaction occurring within the last 10 years: No If all of the above answers are "NO", then may proceed with Cephalosporin use.    DISCHARGE MEDICATIONS:   Allergies as of 01/26/2018      Reactions   Morphine Other (See Comments)   Dramatic drop in BP   Morphine And Related Other (See Comments)   LOW BP   Penicillins Rash   Has patient had a PCN reaction causing immediate rash, facial/tongue/throat swelling, SOB or lightheadedness with hypotension: Yes Has patient had a PCN reaction causing severe rash involving mucus membranes or skin necrosis: No Has patient had a PCN reaction that required hospitalization: No Has patient had a PCN reaction occurring within the last 10 years: No If all of the above answers are "NO", then may proceed with Cephalosporin use.      Medication List    You have not been prescribed any medications.     Today  Patient seen and evaluated today No chest pain No shortness of breath Tolerated diet well  VITAL SIGNS:  Blood pressure 105/66, pulse (!) 57, temperature 97.8 F (36.6 C), temperature source Oral, resp. rate 16, height 5\' 9"  (1.753 m), weight 85.5 kg, SpO2 98 %.  I/O:    Intake/Output Summary (Last 24 hours) at 01/26/2018 1537 Last data filed at 01/26/2018 1414 Gross per 24 hour  Intake 544.08 ml  Output 400  ml  Net 144.08 ml    PHYSICAL EXAMINATION:  Physical Exam  GENERAL:  51 y.o.-year-old patient lying in the bed with no acute distress.  LUNGS: Normal breath sounds bilaterally, no wheezing, rales,rhonchi or crepitation. No use of accessory muscles of respiration.  CARDIOVASCULAR: S1, S2 normal. No murmurs, rubs, or gallops.  ABDOMEN: Soft, non-tender, non-distended. Bowel sounds present. No organomegaly or mass.  NEUROLOGIC: Moves all 4 extremities. PSYCHIATRIC: The patient is alert and oriented x 3.  SKIN: No obvious rash, lesion, or ulcer.   DATA REVIEW:   CBC Recent Labs  Lab 01/26/18 0526  WBC  7.9  HGB 13.3  HCT 41.3  PLT 214    Chemistries  Recent Labs  Lab 01/26/18 0526  NA 139  K 3.9  CL 106  CO2 28  GLUCOSE 106*  BUN 10  CREATININE 0.95  CALCIUM 8.4*    Cardiac Enzymes Recent Labs  Lab 01/25/18 2229  TROPONINI <0.03    Microbiology Results  No results found for this or any previous visit.  RADIOLOGY:  Dg Chest 2 View  Result Date: 01/25/2018 CLINICAL DATA:  Initial evaluation for acute chest pain, palpitations. EXAM: CHEST - 2 VIEW COMPARISON:  Prior radiograph from 01/27/2015. FINDINGS: Cardiac and mediastinal silhouettes are stable in size and contour, and remain within normal limits. Lungs mildly hypoinflated. No focal infiltrates. No pulmonary edema or pleural effusion. No pneumothorax. Surgical clips overlie the lower left chest. No acute osseous abnormality. IMPRESSION: No radiographic evidence for active cardiopulmonary disease. Electronically Signed   By: Jeannine Boga M.D.   On: 01/25/2018 13:30   Ct Angio Chest Pe W Or Wo Contrast  Result Date: 01/25/2018 CLINICAL DATA:  52 year old female with chest pain shortness of breath and nausea. EXAM: CT ANGIOGRAPHY CHEST WITH CONTRAST TECHNIQUE: Multidetector CT imaging of the chest was performed using the standard protocol during bolus administration of intravenous contrast. Multiplanar CT image reconstructions and MIPs were obtained to evaluate the vascular anatomy. CONTRAST:  12mL OMNIPAQUE IOHEXOL 350 MG/ML SOLN COMPARISON:  Radiographs 1314 hours today. Chest CT 12/07/2010. FINDINGS: Cardiovascular: Excellent contrast bolus timing in the pulmonary arterial tree. No focal filling defect identified in the pulmonary arteries to suggest acute pulmonary embolism. No calcified coronary artery atherosclerosis is evident. Cardiac size is at the upper limits of normal. No pericardial effusion. There is little contrast in the aorta today, no thoracic aorta abnormality is evident. Mediastinum/Nodes: Negative.  No lymphadenopathy. Incidental small left breast surgical clips. Negative thoracic inlet. Lungs/Pleura: Major airways are patent. Lower lung volumes compared to 2012 with mild ground-glass opacity in both lungs with a dependent gradient favored due to atelectasis. No pleural effusion. Unchanged small nodule in the left lateral costophrenic angle (benign). No other abnormal pulmonary opacity. Upper Abdomen: Negative visible liver, spleen and stomach. Musculoskeletal: Negative. Review of the MIP images confirms the above findings. IMPRESSION: 1. Negative for acute pulmonary embolus. 2. Atelectasis, no other acute findings in the chest. Electronically Signed   By: Genevie Ann M.D.   On: 01/25/2018 19:26   Nm Myocar Multi W/spect W/wall Motion / Ef  Result Date: 01/26/2018  Findings consistent with prior myocardial infarction with peri-infarct ischemia.  This is a low risk study.  The left ventricular ejection fraction is mildly decreased (45-54%).  Blood pressure demonstrated a normal response to exercise.  There was no ST segment deviation noted during stress.     Follow up with PCP in 1 week.  Management plans discussed with the patient,  family and they are in agreement.  CODE STATUS: Full code    Code Status Orders  (From admission, onward)         Start     Ordered   01/25/18 1820  Full code  Continuous     01/25/18 1820        Code Status History    This patient has a current code status but no historical code status.      TOTAL TIME TAKING CARE OF THIS PATIENT ON DAY OF DISCHARGE: more than 30 minutes.   Saundra Shelling M.D on 01/26/2018 at 3:37 PM  Between 7am to 6pm - Pager - [phone]  After 6pm go to [email or web address] - password [email or web address] [facility]  Office  [phone]  CC: Primary care physician; Mikey College, NP  Note: This dictation was prepared with Dragon dictation along with smaller phrase technology. Any transcriptional errors that  result from this process are unintentional.

## 2018-01-26 NOTE — Progress Notes (Signed)
Advanced care plan.  Purpose of the Encounter: CODE STATUS  Parties in Attendance: Patient  Patient's Decision Capacity: Good  Subjective/Patient's story: Presented to the emergency room for chest pain   Objective/Medical story Needs serial troponin and cardiology evaluation and stress test   Goals of care determination:  Advance care directives goals of care treatment plan discussed Patient wants everything done which includes CPR, intubation ventilator the need arises   CODE STATUS: Full code   Time spent discussing advanced care planning: 16 minutes

## 2018-01-26 NOTE — Progress Notes (Signed)
The patient is admitted to room 254 with the diagnosis of chest pain. Alert and oriented x 4.  Patient oriented to her room, ascom /call bell and staff. Full assessment to epic completed. Will continue to monitor.

## 2018-01-27 ENCOUNTER — Encounter: Payer: Self-pay | Admitting: Nurse Practitioner

## 2018-01-27 DIAGNOSIS — F410 Panic disorder [episodic paroxysmal anxiety] without agoraphobia: Secondary | ICD-10-CM

## 2018-01-27 LAB — HIV ANTIBODY (ROUTINE TESTING W REFLEX): HIV SCREEN 4TH GENERATION: NONREACTIVE

## 2018-01-27 MED ORDER — HYDROXYZINE HCL 25 MG PO TABS: 12.5000 mg | ORAL_TABLET | Freq: Three times a day (TID) | ORAL | 0 refills | Status: DC | PRN

## 2018-01-27 NOTE — Telephone Encounter (Signed)
Mychart message

## 2018-01-27 NOTE — Addendum Note (Signed)
Addended by: Cassell Smiles R on: 01/27/2018 01:12 PM   Modules accepted: Orders

## 2019-01-23 IMAGING — CR DG CHEST 2V
1 series · 2 of 2 positions shown · non-contrast
Comparison: Prior radiograph from 01/27/2015.

CLINICAL DATA: Initial evaluation for acute chest pain,
palpitations.

EXAM:
CHEST - 2 VIEW

[Series 1: dg chest 2 view · 0.14mm/px · 2 of 2 slices shown]
[im 1/2]
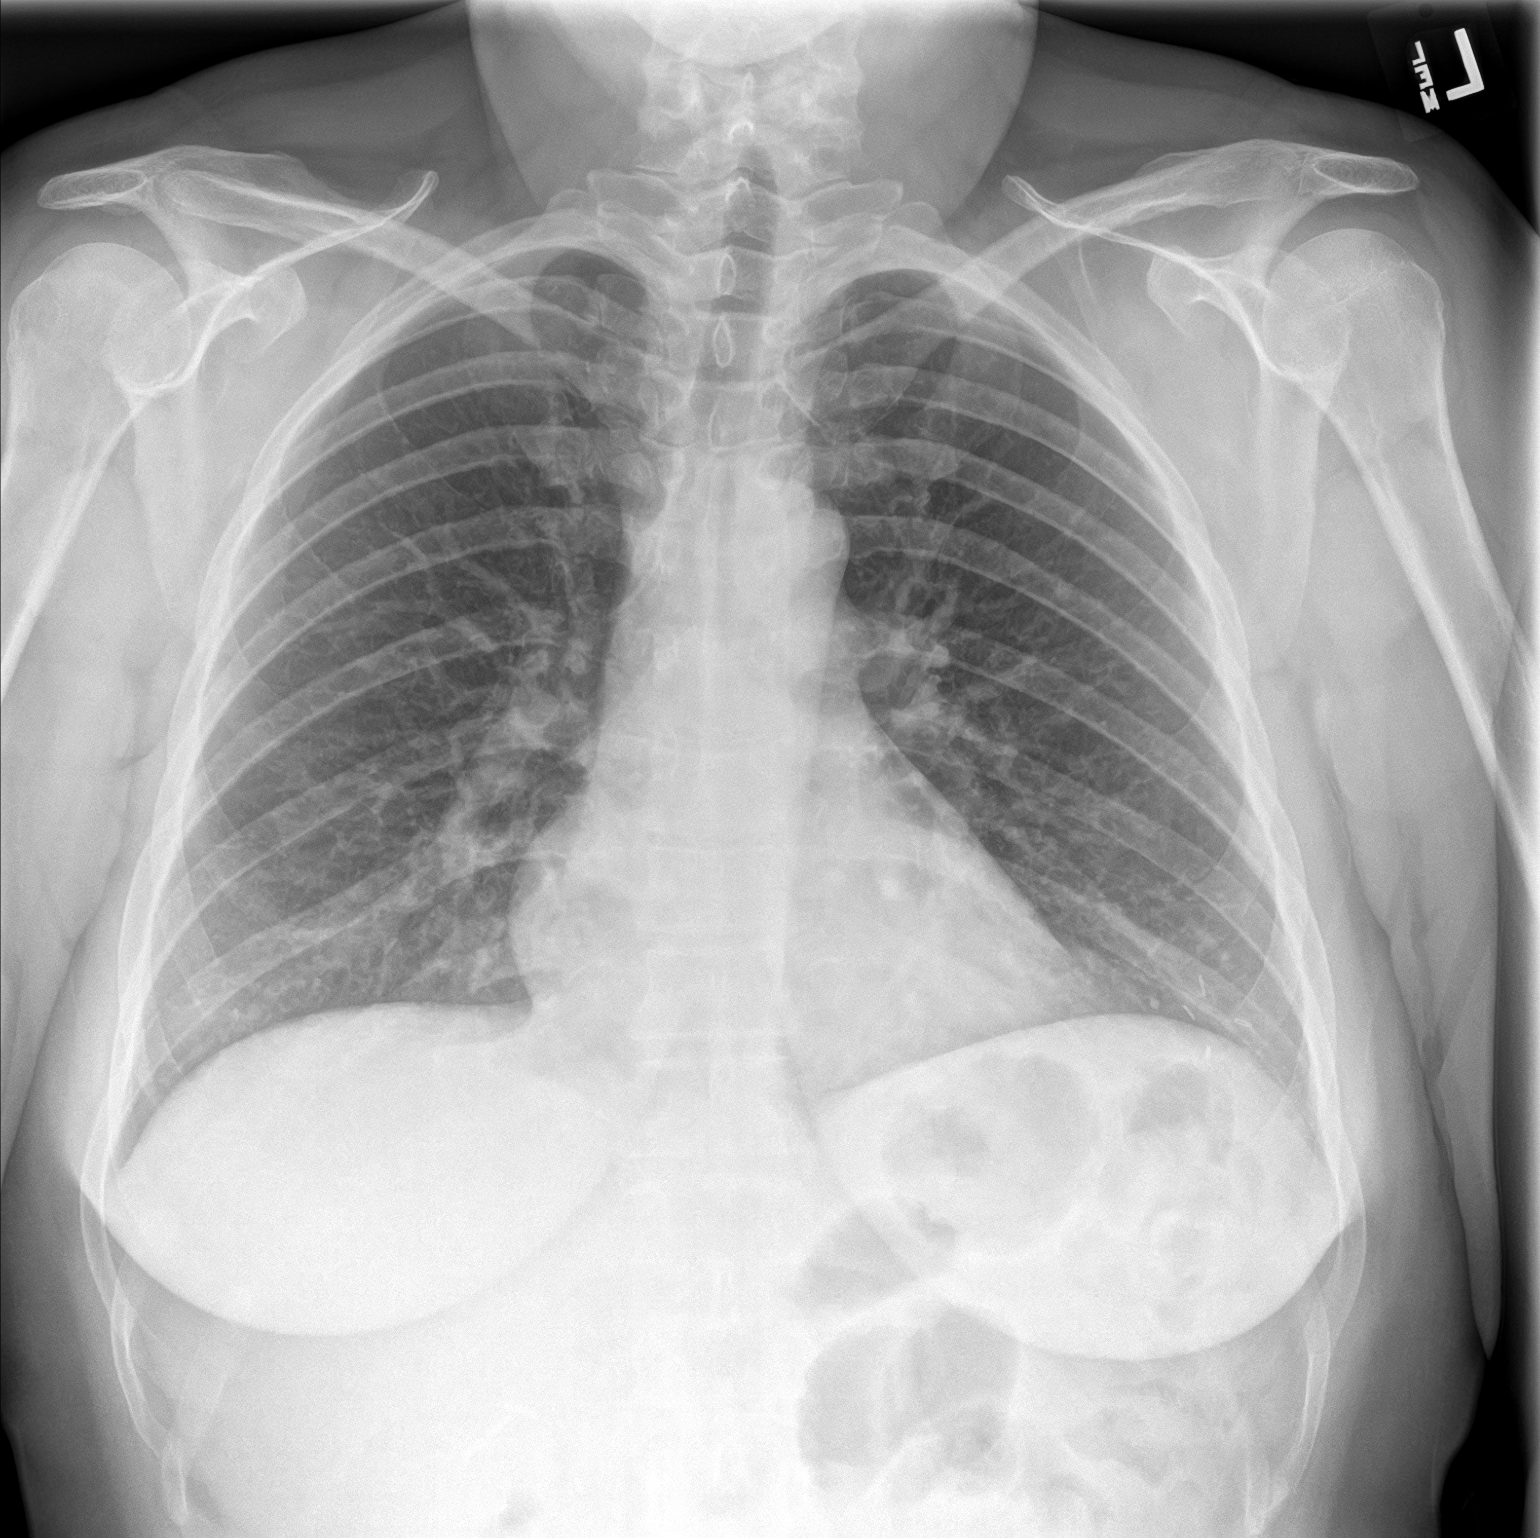
[im 2/2]
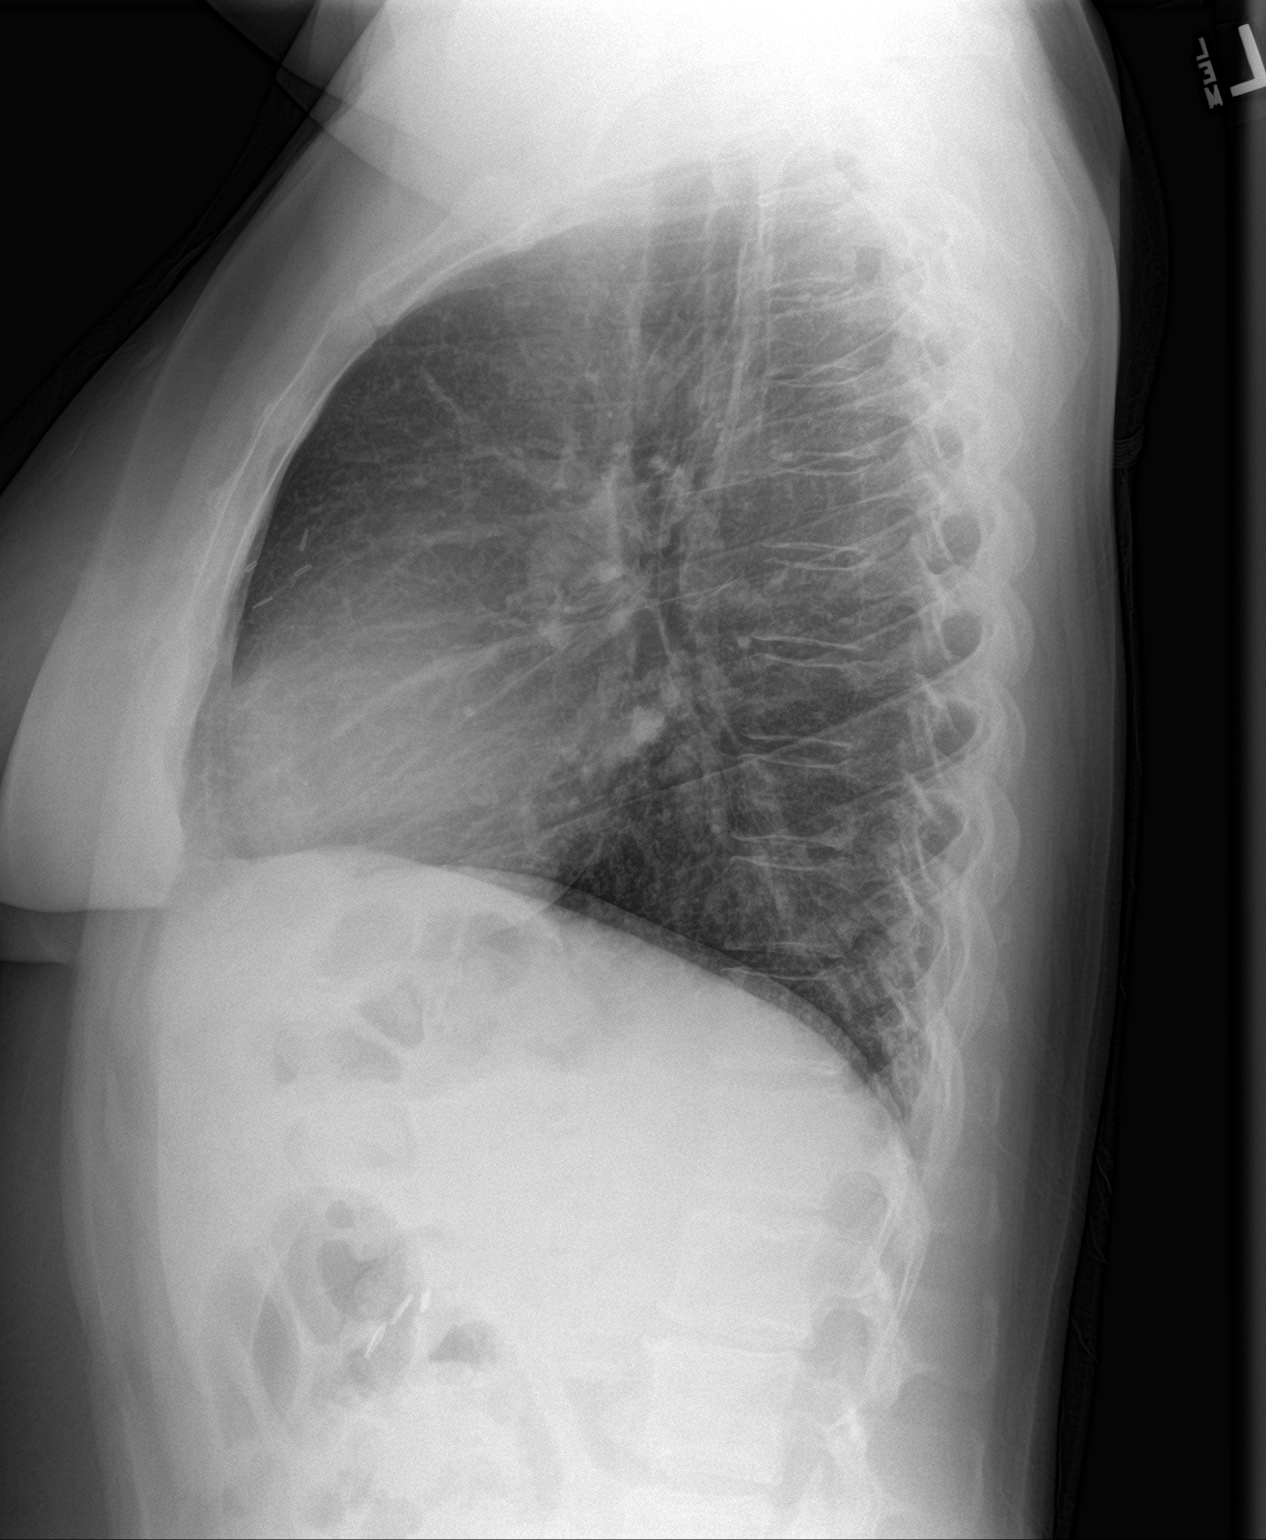

[2 of 2 positions shown; findings below may reference images not displayed]

FINDINGS: Cardiac and mediastinal silhouettes are stable in size and contour,
and remain within normal limits.

Lungs mildly hypoinflated. No focal infiltrates. No pulmonary edema
or pleural effusion. No pneumothorax. Surgical clips overlie the
lower left chest.

No acute osseous abnormality.
IMPRESSION: No radiographic evidence for active cardiopulmonary disease.

## 2019-07-25 ENCOUNTER — Other Ambulatory Visit: Payer: Self-pay

## 2019-07-25 ENCOUNTER — Encounter: Payer: Self-pay | Admitting: Emergency Medicine

## 2019-07-25 ENCOUNTER — Emergency Department
Admission: EM | Admit: 2019-07-25 | Discharge: 2019-07-25 | Disposition: A | Discharge status: Home / Self Care | Payer: Medicaid Other | Attending: Emergency Medicine | Admitting: Emergency Medicine

## 2019-07-25 ENCOUNTER — Emergency Department: Payer: Medicaid Other

## 2019-07-25 DIAGNOSIS — Z5321 Procedure and treatment not carried out due to patient leaving prior to being seen by health care provider: Secondary | ICD-10-CM | POA: Diagnosis not present

## 2019-07-25 DIAGNOSIS — M25562 Pain in left knee: Secondary | ICD-10-CM | POA: Diagnosis present

## 2019-07-25 LAB — CBC
HCT: 42.5 % (ref 36.0–46.0)
Hemoglobin: 14.5 g/dL (ref 12.0–15.0)
MCH: 30.7 pg (ref 26.0–34.0)
MCHC: 34.1 g/dL (ref 30.0–36.0)
MCV: 89.9 fL (ref 80.0–100.0)
Platelets: 231 10*3/uL (ref 150–400)
RBC: 4.73 MIL/uL (ref 3.87–5.11)
RDW: 13.1 % (ref 11.5–15.5)
WBC: 10.6 10*3/uL — ABNORMAL HIGH (ref 4.0–10.5)
nRBC: 0 % (ref 0.0–0.2)

## 2019-07-25 LAB — BASIC METABOLIC PANEL
Anion gap: 10 (ref 5–15)
BUN: 12 mg/dL (ref 6–20)
CO2: 23 mmol/L (ref 22–32)
Calcium: 9.2 mg/dL (ref 8.9–10.3)
Chloride: 106 mmol/L (ref 98–111)
Creatinine, Ser: 0.81 mg/dL (ref 0.44–1.00)
GFR calc Af Amer: >60 mL/min (ref >60)
GFR calc non Af Amer: >60 mL/min (ref >60)
Glucose, Bld: 169 mg/dL — ABNORMAL HIGH (ref 70–99)
Potassium: 3.8 mmol/L (ref 3.5–5.1)
Sodium: 139 mmol/L (ref 135–145)

## 2019-07-25 LAB — TROPONIN I (HIGH SENSITIVITY): Troponin I (High Sensitivity): 3 ng/L (ref <18)

## 2019-07-25 MED ORDER — SODIUM CHLORIDE 0.9% FLUSH: 3.0000 mL | Freq: Once | INTRAVENOUS | Status: DC

## 2019-07-25 NOTE — ED Triage Notes (Signed)
States on Saturday left knee was hurting with two swollen area to knee area.  Today patient awoke with severe left upper arm pain and headache.  States took blood pressure at home and BP 159/119.  AAOx3.  Skin warm and dry.  MAE equally and strong.  NAD

## 2019-11-07 ENCOUNTER — Emergency Department
Admission: EM | Admit: 2019-11-07 | Discharge: 2019-11-07 | Disposition: A | Discharge status: Home / Self Care | Payer: Medicaid Other | Attending: Emergency Medicine | Admitting: Emergency Medicine

## 2019-11-07 ENCOUNTER — Encounter: Payer: Self-pay | Admitting: Emergency Medicine

## 2019-11-07 ENCOUNTER — Other Ambulatory Visit: Payer: Self-pay

## 2019-11-07 ENCOUNTER — Emergency Department: Payer: Medicaid Other

## 2019-11-07 DIAGNOSIS — R0789 Other chest pain: Secondary | ICD-10-CM | POA: Diagnosis not present

## 2019-11-07 DIAGNOSIS — Z5321 Procedure and treatment not carried out due to patient leaving prior to being seen by health care provider: Secondary | ICD-10-CM | POA: Diagnosis not present

## 2019-11-07 LAB — BASIC METABOLIC PANEL
Anion gap: 8 (ref 5–15)
BUN: 11 mg/dL (ref 6–20)
CO2: 24 mmol/L (ref 22–32)
Calcium: 9.1 mg/dL (ref 8.9–10.3)
Chloride: 107 mmol/L (ref 98–111)
Creatinine, Ser: 0.86 mg/dL (ref 0.44–1.00)
GFR calc Af Amer: >60 mL/min (ref >60)
GFR calc non Af Amer: >60 mL/min (ref >60)
Glucose, Bld: 147 mg/dL — ABNORMAL HIGH (ref 70–99)
Potassium: 3.4 mmol/L — ABNORMAL LOW (ref 3.5–5.1)
Sodium: 139 mmol/L (ref 135–145)

## 2019-11-07 LAB — CBC
HCT: 43.1 % (ref 36.0–46.0)
Hemoglobin: 14.4 g/dL (ref 12.0–15.0)
MCH: 30.9 pg (ref 26.0–34.0)
MCHC: 33.4 g/dL (ref 30.0–36.0)
MCV: 92.5 fL (ref 80.0–100.0)
Platelets: 206 10*3/uL (ref 150–400)
RBC: 4.66 MIL/uL (ref 3.87–5.11)
RDW: 13.2 % (ref 11.5–15.5)
WBC: 5.9 10*3/uL (ref 4.0–10.5)
nRBC: 0 % (ref 0.0–0.2)

## 2019-11-07 LAB — TROPONIN I (HIGH SENSITIVITY): Troponin I (High Sensitivity): <2 ng/L (ref <18)

## 2019-11-07 NOTE — ED Triage Notes (Signed)
Pt has had some intermittent pains to left chest over past couple days but today pain became severe and radiating into left neck.  Appears very uncomfortable.  No fever or cough.

## 2020-03-17 ENCOUNTER — Other Ambulatory Visit: Payer: Self-pay

## 2020-03-17 ENCOUNTER — Other Ambulatory Visit: Payer: Medicaid Other

## 2020-03-17 DIAGNOSIS — Z20822 Contact with and (suspected) exposure to covid-19: Secondary | ICD-10-CM

## 2020-03-20 LAB — NOVEL CORONAVIRUS, NAA: SARS-CoV-2, NAA: NOT DETECTED

## 2020-03-24 ENCOUNTER — Other Ambulatory Visit: Payer: Medicaid Other

## 2020-03-24 ENCOUNTER — Other Ambulatory Visit: Payer: Self-pay

## 2020-03-24 DIAGNOSIS — Z20822 Contact with and (suspected) exposure to covid-19: Secondary | ICD-10-CM

## 2020-03-27 LAB — NOVEL CORONAVIRUS, NAA: SARS-CoV-2, NAA: NOT DETECTED

## 2020-08-07 ENCOUNTER — Emergency Department
Admission: EM | Admit: 2020-08-07 | Discharge: 2020-08-07 | Disposition: A | Discharge status: Home / Self Care | Payer: Medicaid Other | Attending: Emergency Medicine | Admitting: Emergency Medicine

## 2020-08-07 ENCOUNTER — Other Ambulatory Visit: Payer: Self-pay

## 2020-08-07 ENCOUNTER — Emergency Department: Payer: Medicaid Other

## 2020-08-07 DIAGNOSIS — R0789 Other chest pain: Secondary | ICD-10-CM | POA: Diagnosis not present

## 2020-08-07 DIAGNOSIS — R079 Chest pain, unspecified: Secondary | ICD-10-CM | POA: Diagnosis present

## 2020-08-07 DIAGNOSIS — F17210 Nicotine dependence, cigarettes, uncomplicated: Secondary | ICD-10-CM | POA: Diagnosis not present

## 2020-08-07 LAB — CBC
HCT: 40 % (ref 36.0–46.0)
Hemoglobin: 13.7 g/dL (ref 12.0–15.0)
MCH: 31.2 pg (ref 26.0–34.0)
MCHC: 34.3 g/dL (ref 30.0–36.0)
MCV: 91.1 fL (ref 80.0–100.0)
Platelets: 247 10*3/uL (ref 150–400)
RBC: 4.39 MIL/uL (ref 3.87–5.11)
RDW: 13.1 % (ref 11.5–15.5)
WBC: 8.7 10*3/uL (ref 4.0–10.5)
nRBC: 0 % (ref 0.0–0.2)

## 2020-08-07 LAB — BASIC METABOLIC PANEL
Anion gap: 9 (ref 5–15)
BUN: 12 mg/dL (ref 6–20)
CO2: 26 mmol/L (ref 22–32)
Calcium: 8.9 mg/dL (ref 8.9–10.3)
Chloride: 103 mmol/L (ref 98–111)
Creatinine, Ser: 0.83 mg/dL (ref 0.44–1.00)
GFR, Estimated: >60 mL/min (ref >60)
Glucose, Bld: 140 mg/dL — ABNORMAL HIGH (ref 70–99)
Potassium: 3.5 mmol/L (ref 3.5–5.1)
Sodium: 138 mmol/L (ref 135–145)

## 2020-08-07 LAB — TROPONIN I (HIGH SENSITIVITY)
Troponin I (High Sensitivity): <2 ng/L (ref <18)
Troponin I (High Sensitivity): <2 ng/L (ref <18)

## 2020-08-07 LAB — D-DIMER, QUANTITATIVE: D-Dimer, Quant: 0.48 ug/mL-FEU (ref 0.00–0.50)

## 2020-08-07 MED ORDER — ASPIRIN 81 MG PO CHEW
324.0000 mg | CHEWABLE_TABLET | Freq: Once | ORAL | Status: AC
  Administered 2020-08-07: 324 mg via ORAL
  Filled 2020-08-07: qty 4

## 2020-08-07 NOTE — ED Provider Notes (Signed)
[facility] Emergency Department Provider Note   ____________________________________________   Event Date/Time   First MD Initiated Contact with Patient 08/07/20 1549     (approximate)  I have reviewed the triage vital signs and the nursing notes.   HISTORY  Chief Complaint Chest Pain    HPI A 55 year old patient presents for evaluation of chest pain. Initial onset of pain was approximately 1-3 hours ago. The patient's chest pain is well-localized, is sharp and is not worse with exertion. The patient's chest pain is middle- or left-sided, is not described as heaviness/pressure/tightness and does not radiate to the arms/jaw/neck. The patient does not complain of nausea and denies diaphoresis. The patient has smoked in the past 90 days. The patient has no history of stroke, has no history of peripheral artery disease, denies any history of treated diabetes, has no relevant family history of coronary artery disease (first degree relative at less than age 73), is not hypertensive, has no history of hypercholesterolemia and does not have an elevated BMI (>=30).   Patient was washing dishes just about 1 hour prior to arrival.  She reports that while washing dishes she started noticing sharp pain in her left upper chest.  Does not radiate.  No fevers or chills no recent illness no cough.  She is a smoker.  Reports she has had stress test a couple years ago, does not follow with a cardiologist  No pain right now is mild sharp located left upper chest.  Nothing seems to make it better or worse.  Not worsened by movement.  No recent surgeries no leg swelling no history of blood clots.  She does smoke  Previous cholecystectomy and hysterectomy    Past Medical History:  Diagnosis Date  . Anxiety   . Osteopenia   . Wears contact lenses   . Wears dentures    partial upper    Patient Active Problem List   Diagnosis Date Noted  . Chest pain 01/25/2018  . Arthritis  05/27/2017  . History of lumpectomy of left breast 05/27/2017  . Colonic constipation   . Polyp of sigmoid colon   . Rectal polyp   . Benign breast neoplasm 11/20/2014  . 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 ABDOMINAL HYSTERECTOMY    . CHOLECYSTECTOMY    . COLONOSCOPY WITH PROPOFOL N/A 12/24/2015   Procedure: COLONOSCOPY WITH PROPOFOL;  Surgeon: Lucilla Lame, MD;  Location: [place];  Service: Endoscopy;  Laterality: N/A;  . EYE SURGERY    . POLYPECTOMY  12/24/2015   Procedure: POLYPECTOMY;  Surgeon: Lucilla Lame, MD;  Location: [place];  Service: Endoscopy;;  . TUBAL LIGATION      Prior to Admission medications   Medication Sig Start Date End Date Taking? Authorizing Provider  hydrOXYzine (ATARAX/VISTARIL) 25 MG tablet Take 0.5-1 tablets (12.5-25 mg total) by mouth 3 (three) times daily as needed for anxiety. 01/27/18   Mikey College, NP    Allergies Morphine, Morphine and related, and Penicillins  Family History  Problem Relation Age of Onset  . COPD Mother   . Arthritis/Rheumatoid Mother   . Hypertension Mother   . Anxiety disorder Sister   . Stroke Maternal Grandmother   . Cancer - Lung Maternal Grandfather   . Heart attack Maternal Grandfather     Social History Social History   Tobacco Use  . Smoking status: Current Every Day Smoker  Packs/day: 0.50    Years: 30.00    Pack years: 15.00    Types: Cigarettes  . Smokeless tobacco: Never Used  Substance Use Topics  . Alcohol use: Yes    Comment: 2-3 glasses wine/ month  . Drug use: No    Review of Systems Constitutional: No fever/chills Eyes: No visual changes. ENT: No sore throat. Cardiovascular: Left-sided sharp pain.  Noted while washing dishes.  Not worsened by deep breathing.  Located just below the area of the shoulder. Respiratory: Denies shortness of breath. Gastrointestinal: Felt like she was  having some "heartburn" for the last 2 days located in the middle of her chest burning underneath her breastbone.  No abdominal pain no nausea vomiting. Genitourinary: Negative for dysuria. Musculoskeletal: Negative for back pain. Skin: Negative for rash. Neurological: Negative for headaches, areas of focal weakness or numbness.    ____________________________________________   PHYSICAL EXAM:  VITAL SIGNS: ED Triage Vitals [08/07/20 1516]  Enc Vitals Group     BP 138/90     Pulse Rate 80     Resp 17     Temp 98.3 F (36.8 C)     Temp Source Oral     SpO2 98 %     Weight 170 lb (77.1 kg)     Height 5\' 9"  (1.753 m)     Head Circumference      Peak Flow      Pain Score 7     Pain Loc      Pain Edu?      Excl. in [place]?     Constitutional: Alert and oriented. Well appearing and in no acute distress.  Very pleasant in no distress.  Normal hemodynamics Eyes: Conjunctivae are normal. Head: Atraumatic. Nose: No congestion/rhinnorhea. Mouth/Throat: Mucous membranes are moist. Neck: No stridor.  Cardiovascular: Normal rate, regular rhythm. Grossly normal heart sounds.  Good peripheral circulation.  No reproducible chest pain on exam.  Range arms to range of motion no pain elicited.  Pain is not pleuritic.  She points and locates the pain just below the left shoulder reports to be sharp mild at this time.  Reports it was a little more severe earlier Respiratory: Normal respiratory effort.  No retractions. Lungs CTAB. Gastrointestinal: Soft and nontender. No distention.  Patient does report she has like a little bit of a discomfort in her right flank now, possibly from position on the bed.  Does not seem to be related to chest pain she reports she is just here to get evaluated for the chest pain Musculoskeletal: No lower extremity tenderness nor edema. Neurologic:  Normal speech and language. No gross focal neurologic deficits are appreciated.  Skin:  Skin is warm, dry and intact. No  rash noted. Psychiatric: Mood and affect are normal. Speech and behavior are normal.  ____________________________________________   LABS (all labs ordered are listed, but only abnormal results are displayed)  Labs Reviewed  BASIC METABOLIC PANEL - Abnormal; Notable for the following components:      Result Value   Glucose, Bld 140 (*)    All other components within normal limits  CBC  D-DIMER, QUANTITATIVE  TROPONIN I (HIGH SENSITIVITY)  TROPONIN I (HIGH SENSITIVITY)   ____________________________________________  EKG  Is reviewed inter by me at 1515 Heart rate 89 QRS 99 QTc 460 Normal sinus rhythm no evidence of acute ischemia.  Mild T wave abnormality seen in V2, felt unlikely to be related to acute ischemia however and this is very similar to EKG  from November 07, 2019  ____________________________________________  RADIOLOGY  DG Chest 2 View  Result Date: 08/07/2020 CLINICAL DATA:  Chest pain and shortness of breath EXAM: CHEST - 2 VIEW COMPARISON:  November 07, 2019 FINDINGS: Lungs are clear. Heart size and pulmonary vascularity are normal. No adenopathy. No pneumothorax. No bone lesions. Surgical clips noted in upper right abdomen. IMPRESSION: No edema or airspace opacity. Cardiac silhouette within normal limits. Electronically Signed   By: Lowella Grip III M.D.   On: 08/07/2020 15:49    Chest x-ray reviewed negative for acute finding. ____________________________________________   PROCEDURES  Procedure(s) performed: None  Procedures  Critical Care performed: no____________________________________________   INITIAL IMPRESSION / ASSESSMENT AND PLAN / ED COURSE  Pertinent labs & imaging results that were available during my care of the patient were reviewed by me and considered in my medical decision making (see chart for details).   Differential diagnosis includes, but is not limited to, ACS, aortic dissection, pulmonary embolism, cardiac tamponade,  pneumothorax, pneumonia, pericarditis, myocarditis, GI-related causes including esophagitis/gastritis, and musculoskeletal chest wall pain.    Pain is fairly atypical sharp located up in the left upper chest.  Not reproducible on exam though.  Low risk for pulmonary embolism, will send D-dimer for screening low risk by [place].  No ripping tearing or moving pain.  No signs or symptoms suggest acute dissection.  Chest x-ray reassuring with normal mediastinum  First troponin is normal, labs as of 1545 essentially unremarkable with normal first troponin.  Clinical Course as of 08/07/20 1730  Tue Aug 07, 2020  1729     Clinical Course User Index [MQ] Delman Kitten, MD    ----------------------------------------- 6:45 PM on 08/07/2020 -----------------------------------------  Patient's work-up very reassuring at this point.  She is resting comfortably, reports that she actually needs to go to go pick up her child at school.  Reports her child was there and she thought someone also picked him up.  Thankfully at this point her labs have resulted and her troponins remain normal with a negative D-dimer.  She is awake alert in no distress or discomfort at this time.  We will have her follow-up with cardiology, discussed return precautions with the patient and she is in agreement with plan  HEAR Score: 2   Return precautions and treatment recommendations and follow-up discussed with the patient who is agreeable with the plan. ____________________________________________   FINAL CLINICAL IMPRESSION(S) / ED DIAGNOSES  Final diagnoses:  Atypical chest pain        Note:  This document was prepared using Dragon voice recognition software and may include unintentional dictation errors       Delman Kitten, MD 08/07/20 1845

## 2020-08-07 NOTE — Discharge Instructions (Signed)

## 2020-08-07 NOTE — ED Triage Notes (Signed)
Pt c/o left sided chest pain that radiates into the back with nausea about 1hr PTA. Pt is in NAD at present , skin is warm and dry.

## 2020-11-04 IMAGING — CR DG CHEST 2V
1 series · 2 of 2 positions shown · non-contrast
Comparison: Radiograph 07/25/2019, CT 01/26/2019

CLINICAL DATA: Chest pain

EXAM:
CHEST - 2 VIEW

[Series 1: w chest pa · 0.14mm/px · 2 of 2 slices shown]
[im 1/2]
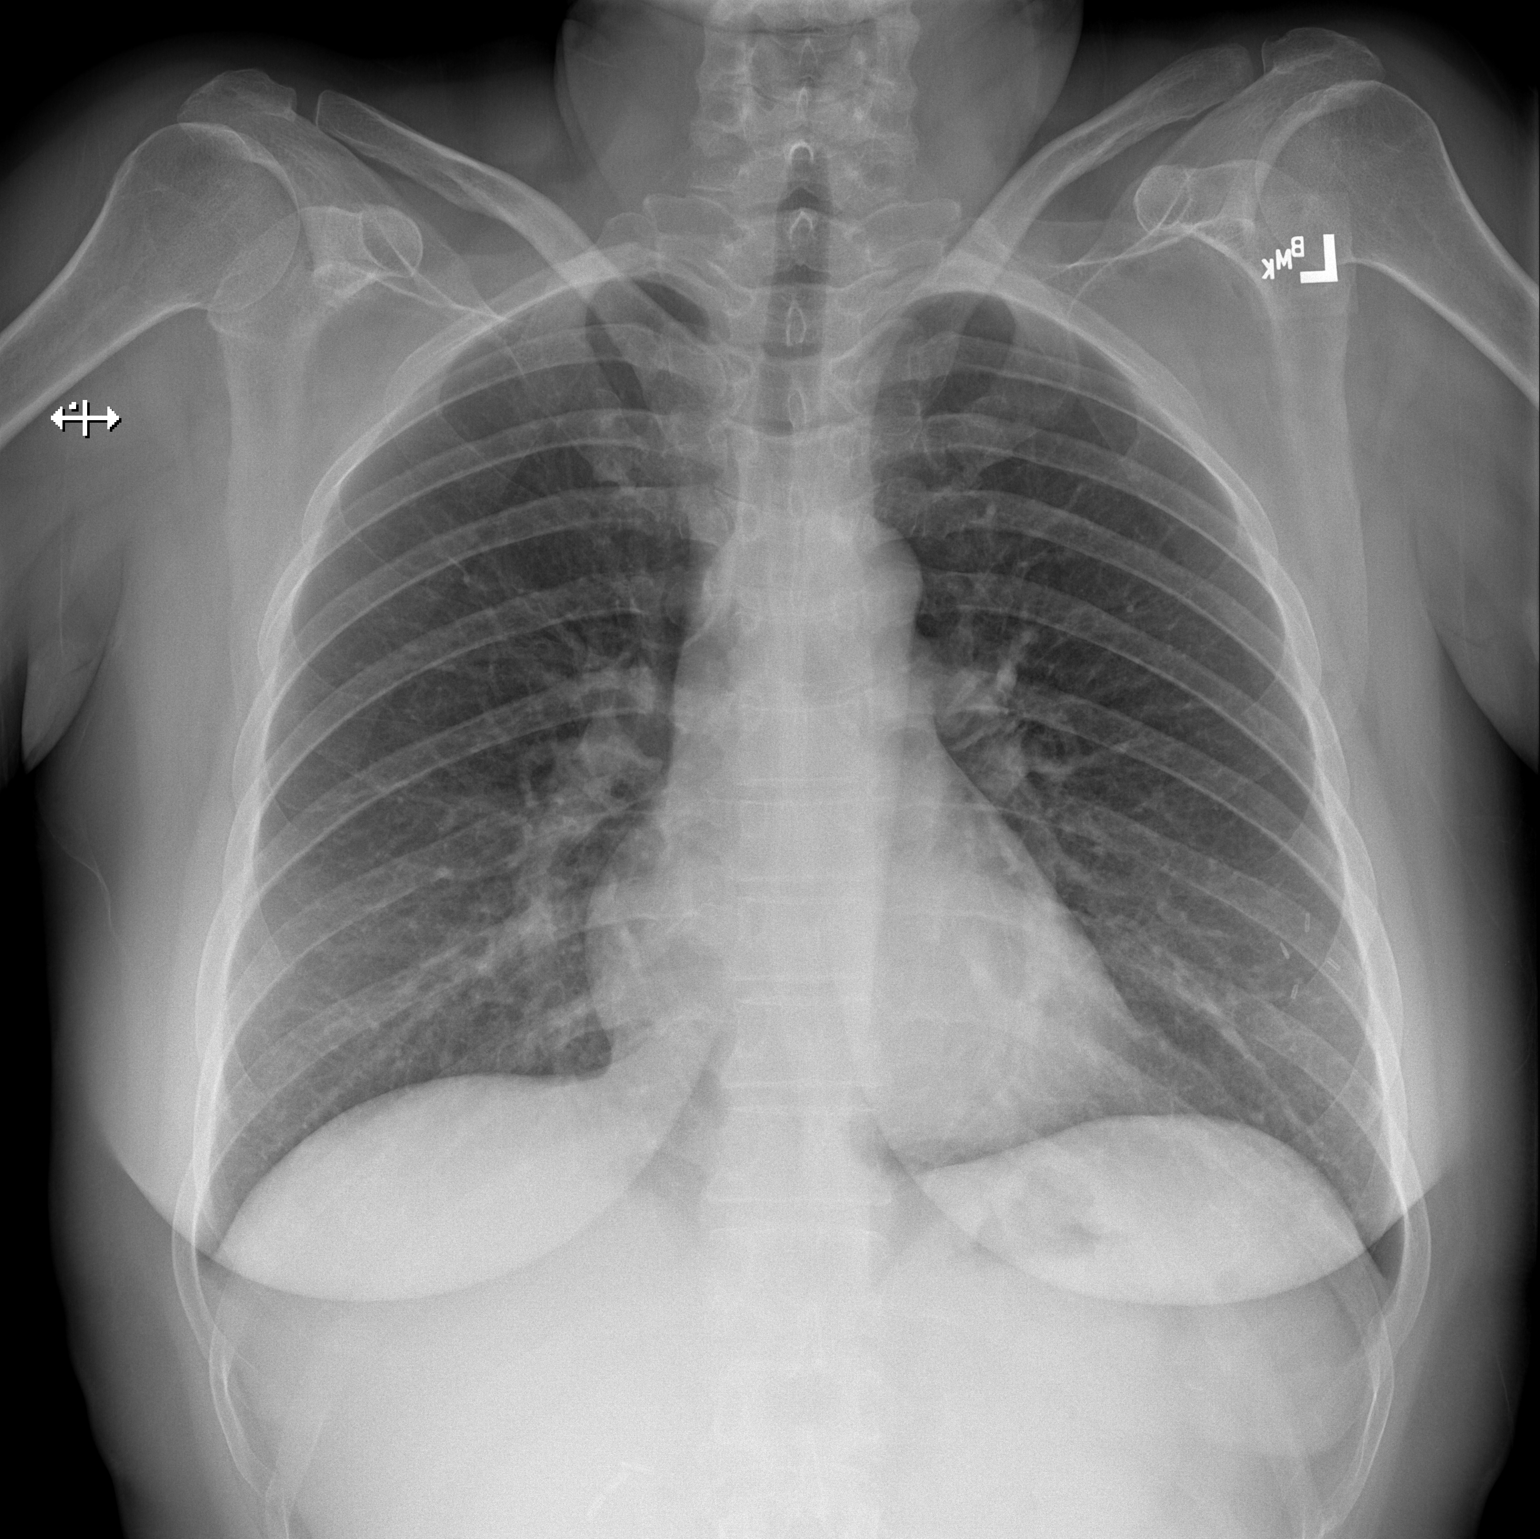
[im 2/2]
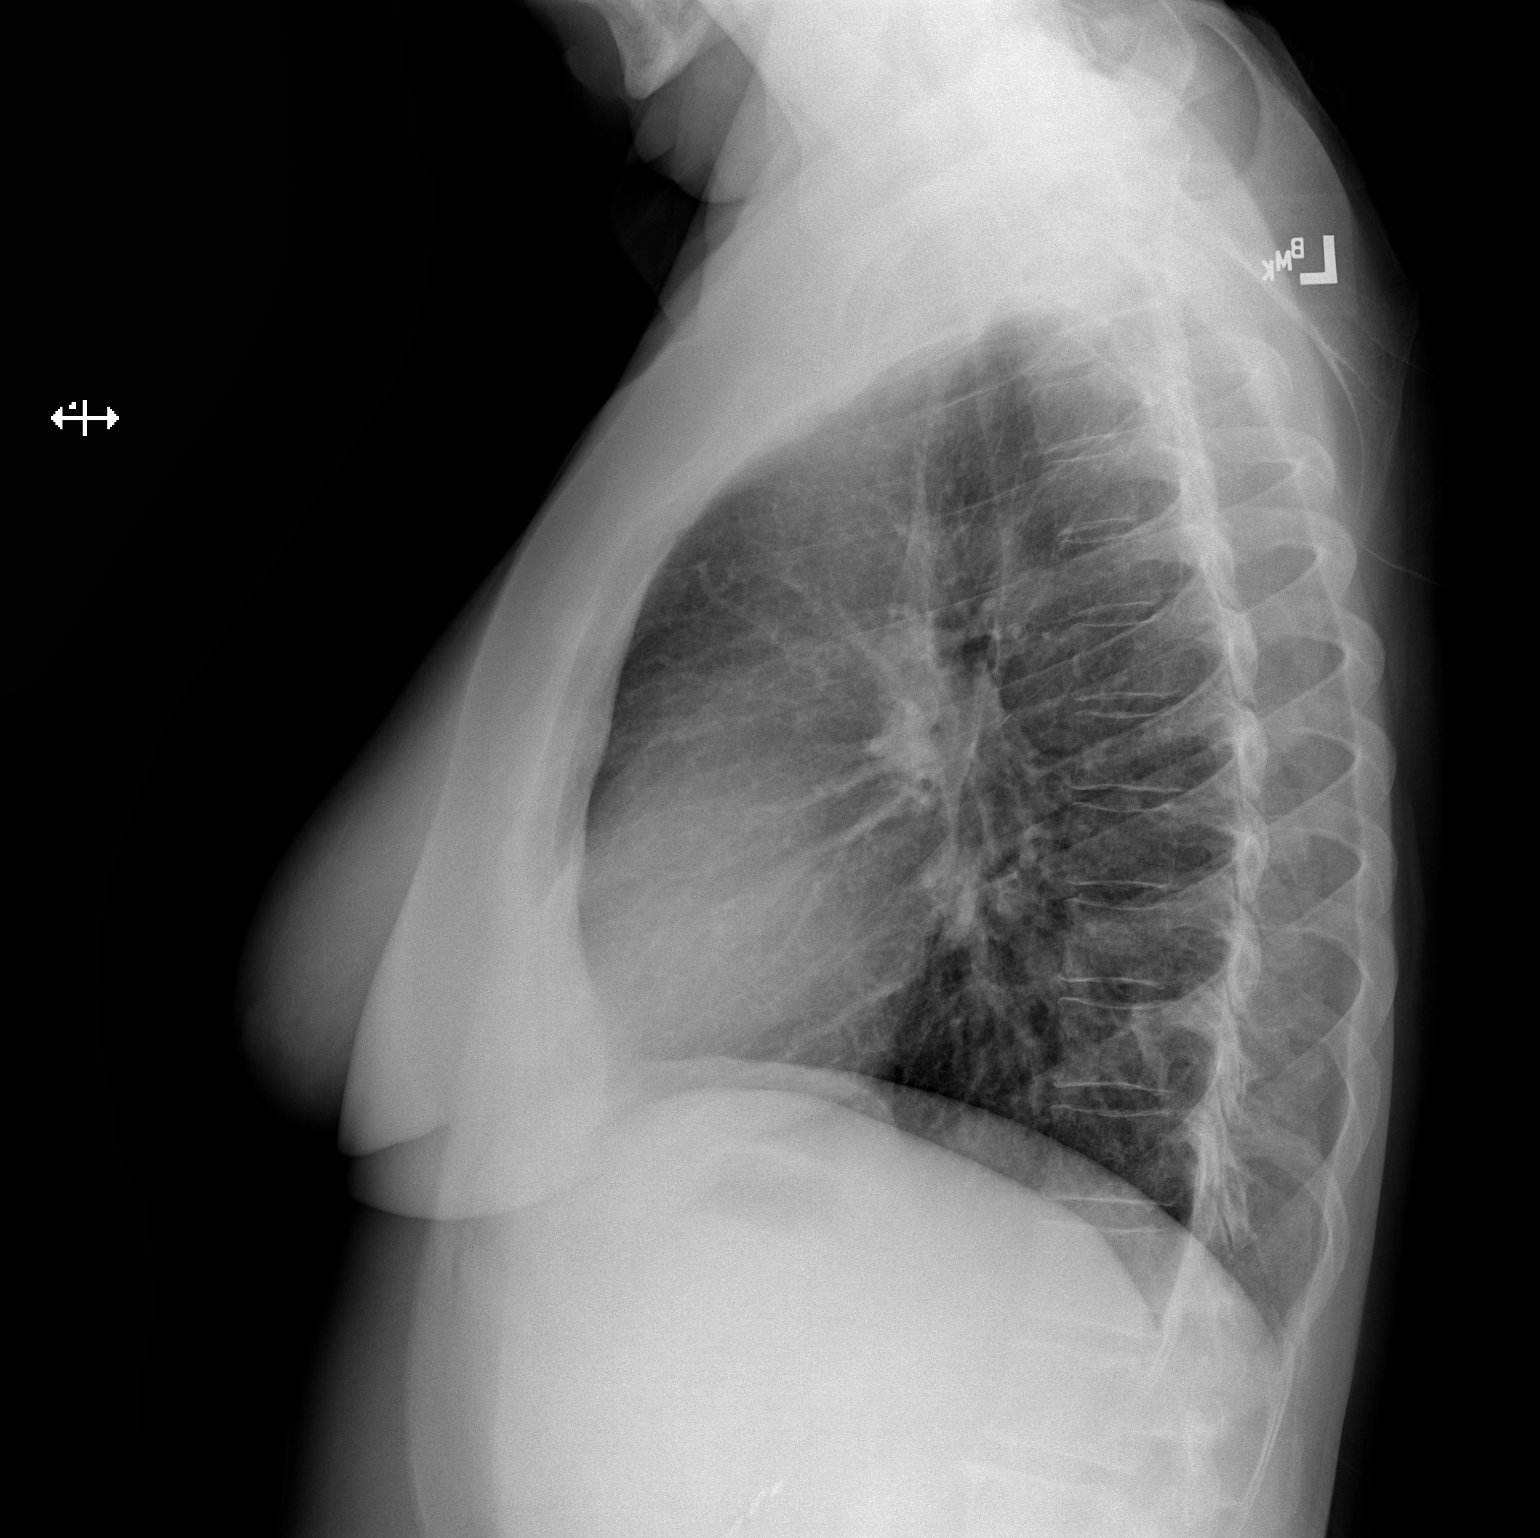

[2 of 2 positions shown; findings below may reference images not displayed]

FINDINGS: No consolidation, features of edema, pneumothorax, or effusion.
Pulmonary vascularity is normally distributed. The cardiomediastinal
contours are unremarkable. No acute osseous or soft tissue
abnormality. Surgical clips again seen in the left breast.
Cholecystectomy clips seen in the upper abdomen.
IMPRESSION: No acute cardiopulmonary abnormality.

## 2021-02-13 ENCOUNTER — Other Ambulatory Visit: Payer: Self-pay

## 2021-02-13 ENCOUNTER — Emergency Department
Admission: EM | Admit: 2021-02-13 | Discharge: 2021-02-13 | Disposition: A | Discharge status: Home / Self Care | Payer: Medicaid Other | Attending: Student in an Organized Health Care Education/Training Program | Admitting: Student in an Organized Health Care Education/Training Program

## 2021-02-13 DIAGNOSIS — Z87891 Personal history of nicotine dependence: Secondary | ICD-10-CM | POA: Insufficient documentation

## 2021-02-13 DIAGNOSIS — T782XXA Anaphylactic shock, unspecified, initial encounter: Secondary | ICD-10-CM | POA: Diagnosis present

## 2021-02-13 LAB — COMPREHENSIVE METABOLIC PANEL
ALT: 19 U/L (ref 0–44)
AST: 33 U/L (ref 15–41)
Albumin: 4.2 g/dL (ref 3.5–5.0)
Alkaline Phosphatase: 64 U/L (ref 38–126)
Anion gap: 9 (ref 5–15)
BUN: 12 mg/dL (ref 6–20)
CO2: 20 mmol/L — ABNORMAL LOW (ref 22–32)
Calcium: 8.7 mg/dL — ABNORMAL LOW (ref 8.9–10.3)
Chloride: 107 mmol/L (ref 98–111)
Creatinine, Ser: 0.78 mg/dL (ref 0.44–1.00)
GFR, Estimated: >60 mL/min (ref >60)
Glucose, Bld: 225 mg/dL — ABNORMAL HIGH (ref 70–99)
Potassium: 3.4 mmol/L — ABNORMAL LOW (ref 3.5–5.1)
Sodium: 136 mmol/L (ref 135–145)
Total Bilirubin: 1 mg/dL (ref 0.3–1.2)
Total Protein: 7 g/dL (ref 6.5–8.1)

## 2021-02-13 LAB — URINALYSIS, COMPLETE (UACMP) WITH MICROSCOPIC
Bacteria, UA: NONE SEEN
Bilirubin Urine: NEGATIVE
Glucose, UA: 100 mg/dL — AB
Leukocytes,Ua: NEGATIVE
Nitrite: NEGATIVE
Protein, ur: 30 mg/dL — AB
Specific Gravity, Urine: >1.03 — ABNORMAL HIGH (ref 1.005–1.030)
pH: 5.5 (ref 5.0–8.0)

## 2021-02-13 LAB — CBC
HCT: 42.3 % (ref 36.0–46.0)
Hemoglobin: 14 g/dL (ref 12.0–15.0)
MCH: 31 pg (ref 26.0–34.0)
MCHC: 33.1 g/dL (ref 30.0–36.0)
MCV: 93.8 fL (ref 80.0–100.0)
Platelets: 297 10*3/uL (ref 150–400)
RBC: 4.51 MIL/uL (ref 3.87–5.11)
RDW: 13.4 % (ref 11.5–15.5)
WBC: 12.3 10*3/uL — ABNORMAL HIGH (ref 4.0–10.5)
nRBC: 0 % (ref 0.0–0.2)

## 2021-02-13 MED ORDER — LACTATED RINGERS IV BOLUS
1000.0000 mL | Freq: Once | INTRAVENOUS | Status: AC
  Administered 2021-02-13: 1000 mL via INTRAVENOUS

## 2021-02-13 MED ORDER — PREDNISONE 20 MG PO TABS: 40.0000 mg | ORAL_TABLET | Freq: Every day | ORAL | 0 refills | Status: AC

## 2021-02-13 MED ORDER — EPINEPHRINE 0.3 MG/0.3ML IJ SOAJ: 0.3000 mg | INTRAMUSCULAR | 1 refills | Status: DC | PRN

## 2021-02-13 NOTE — ED Triage Notes (Signed)
Pt reports they have taken Amoxacillin throughout their life and have never had an issue until today. Last time taking was approximately 4 months ago with no issues.

## 2021-02-13 NOTE — ED Triage Notes (Signed)
Pt prescribed Amoxacillin last week for tooth infection, did not take Rx but had it filled. Woke up this AM with vaginal redness/itchiness so she took a dose of Amoxacillin, approximately 30 minutes after pt reports emesis, urticaria, and itching. Pt reports they took a singly PO benadryl and called ambulance. Medic reports they gave 0.3 epi IM, 50 mg benadryl IV, 125 solumedrol IV, 20 mg pepcid IV, and 4 mg zofran IV.

## 2021-02-13 NOTE — ED Provider Notes (Signed)
[facility] Emergency Department Provider Note    Event Date/Time   First MD Initiated Contact with Patient 02/13/21 [phone]     (approximate)  I have reviewed the triage vital signs and the nursing notes.   HISTORY  Chief Complaint Allergic Reaction (Allergic Rx to Amoxicillin )    HPI Nichole Hall is a 55 y.o. female with history of anxiety presents to the ER for evaluation of allergic reaction that occurred after she took amoxicillin this morning.  States that she took that this morning because she was having some dysuria and symptoms of urinary tract faction was trying to self medicate rather than go to the doctor's office.  And states that shortly thereafter she started having shortness of breath and had hives develop all over.  No other new foods no insect bites.  EMS was called she was given epinephrine, Benadryl, Pepcid, Solu-Medrol.  She is feeling significantly improved.  Past Medical History:  Diagnosis Date   Anxiety    Osteopenia    Wears contact lenses    Wears dentures    partial upper   Family History  Problem Relation Age of Onset   COPD Mother    Arthritis/Rheumatoid Mother    Hypertension Mother    Anxiety disorder Sister    Stroke Maternal Grandmother    Cancer - Lung Maternal Grandfather    Heart attack Maternal Grandfather    Past Surgical History:  Procedure Laterality Date   ABDOMINAL HYSTERECTOMY     CHOLECYSTECTOMY     COLONOSCOPY WITH PROPOFOL N/A 12/24/2015   Procedure: COLONOSCOPY WITH PROPOFOL;  Surgeon: Lucilla Lame, MD;  Location: [place];  Service: Endoscopy;  Laterality: N/A;   EYE SURGERY     POLYPECTOMY  12/24/2015   Procedure: POLYPECTOMY;  Surgeon: Lucilla Lame, MD;  Location: [place];  Service: Endoscopy;;   TUBAL LIGATION     Patient Active Problem List   Diagnosis Date Noted   Chest pain 01/25/2018   Arthritis 05/27/2017   History of lumpectomy of left breast 05/27/2017    Colonic constipation    Polyp of sigmoid colon    Rectal polyp    Benign breast neoplasm 11/20/2014   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 Authorizing Provider  EPINEPHrine 0.3 mg/0.3 mL IJ SOAJ injection Inject 0.3 mg into the muscle as needed for anaphylaxis. 02/13/21  Yes Merlyn Lot, MD  predniSONE (DELTASONE) 20 MG tablet Take 2 tablets (40 mg total) by mouth daily for 4 days. 02/14/21 02/18/21 Yes Merlyn Lot, MD  hydrOXYzine (ATARAX/VISTARIL) 25 MG tablet Take 0.5-1 tablets (12.5-25 mg total) by mouth 3 (three) times daily as needed for anxiety. 01/27/18   Mikey College, NP    Allergies Morphine, Morphine and related, and Penicillins    Social History Social History   Tobacco Use   Smoking status: Former    Packs/day: 0.50    Years: 30.00    Pack years: 15.00    Types: Cigarettes   Smokeless tobacco: Never  Substance Use Topics   Alcohol use: Yes    Comment: 2-3 glasses wine/ month   Drug use: No    Review of Systems Patient denies headaches, rhinorrhea, blurry vision, numbness, shortness of breath, chest pain, edema, cough, abdominal pain, nausea, vomiting, diarrhea, dysuria, fevers, rashes or hallucinations unless otherwise stated above in HPI. ____________________________________________   PHYSICAL EXAM:  VITAL SIGNS: Vitals:   02/13/21 0830 02/13/21 1000  BP: (!) 99/56 104/71  Pulse: 71 81  Resp: 18 16  Temp:    SpO2: 98% 100%    Constitutional: Alert and oriented.  Eyes: Conjunctivae are normal.  Head: Atraumatic. Nose: No congestion/rhinnorhea. Mouth/Throat: Mucous membranes are moist.   Neck: No stridor. Painless ROM.  Cardiovascular: Normal rate, regular rhythm. Grossly normal heart sounds.  Good peripheral circulation. Respiratory: Normal respiratory effort.  No retractions. Lungs CTAB. Gastrointestinal: Soft and nontender. No  distention. No abdominal bruits. No CVA tenderness. Genitourinary:  Musculoskeletal: No lower extremity tenderness nor edema.  No joint effusions. Neurologic:  Normal speech and language. No gross focal neurologic deficits are appreciated. No facial droop Skin:  Skin is warm, dry and intact. No rash noted. Psychiatric: Mood and affect are normal. Speech and behavior are normal.  ____________________________________________   LABS (all labs ordered are listed, but only abnormal results are displayed)  Results for orders placed or performed during the hospital encounter of 02/13/21 (from the past 24 hour(s))  CBC     Status: Abnormal   Collection Time: 02/13/21  8:15 AM  Result Value Ref Range   WBC 12.3 (H) 4.0 - 10.5 K/uL   RBC 4.51 3.87 - 5.11 MIL/uL   Hemoglobin 14.0 12.0 - 15.0 g/dL   HCT 42.3 36.0 - 46.0 %   MCV 93.8 80.0 - 100.0 fL   MCH 31.0 26.0 - 34.0 pg   MCHC 33.1 30.0 - 36.0 g/dL   RDW 13.4 11.5 - 15.5 %   Platelets 297 150 - 400 K/uL   nRBC 0.0 0.0 - 0.2 %  Comprehensive metabolic panel     Status: Abnormal   Collection Time: 02/13/21  8:15 AM  Result Value Ref Range   Sodium 136 135 - 145 mmol/L   Potassium 3.4 (L) 3.5 - 5.1 mmol/L   Chloride 107 98 - 111 mmol/L   CO2 20 (L) 22 - 32 mmol/L   Glucose, Bld 225 (H) 70 - 99 mg/dL   BUN 12 6 - 20 mg/dL   Creatinine, Ser 0.78 0.44 - 1.00 mg/dL   Calcium 8.7 (L) 8.9 - 10.3 mg/dL   Total Protein 7.0 6.5 - 8.1 g/dL   Albumin 4.2 3.5 - 5.0 g/dL   AST 33 15 - 41 U/L   ALT 19 0 - 44 U/L   Alkaline Phosphatase 64 38 - 126 U/L   Total Bilirubin 1.0 0.3 - 1.2 mg/dL   GFR, Estimated >60 >60 mL/min   Anion gap 9 5 - 15  Urinalysis, Complete w Microscopic Urine, Clean Catch     Status: Abnormal   Collection Time: 02/13/21  9:02 AM  Result Value Ref Range   Color, Urine YELLOW YELLOW   APPearance CLEAR CLEAR   Specific Gravity, Urine >1.030 (H) 1.005 - 1.030   pH 5.5 5.0 - 8.0   Glucose, UA 100 (A) NEGATIVE mg/dL   Hgb  urine dipstick MODERATE (A) NEGATIVE   Bilirubin Urine NEGATIVE NEGATIVE   Ketones, ur TRACE (A) NEGATIVE mg/dL   Protein, ur 30 (A) NEGATIVE mg/dL   Nitrite NEGATIVE NEGATIVE   Leukocytes,Ua NEGATIVE NEGATIVE   Squamous Epithelial / LPF 6-10 0 - 5   WBC, UA 0-5 0 - 5 WBC/hpf   RBC / HPF 6-10 0 - 5 RBC/hpf   Bacteria, UA NONE SEEN NONE SEEN   Mucus PRESENT    ____________________________________________  EKG____________________________________________  RADIOLOGY   ____________________________________________  PROCEDURES  Procedure(s) performed:  Procedures    Critical Care performed: no ____________________________________________   INITIAL IMPRESSION / ASSESSMENT AND PLAN / ED COURSE  Pertinent labs & imaging results that were available during my care of the patient were reviewed by me and considered in my medical decision making (see chart for details).   DDX: anaphylaxis, urticaria, cystitis, pyelo, sepsis  Yuliana Vandrunen is a 55 y.o. who presents to the ED with Hayden Pedro as described above.  Consistent with anaphylactic reaction now improved after EpiPen.  Appropriately treated by EMS.  She is protecting her airway hemodynamically stable.  Blood work reassuring does show mild hyperglycemia leukocytosis likely secondary to EpiPen's as steroid.  Patient observed in the ER for 3 hours patient feeling well and in no acute distress.  Advised of suspected allergy to amoxicillin to avoid this medication in the future.  Will be given prescription for EpiPen as well as short course of steroid.  We discussed strict return precautions.  Patient agreeable to plan.     The patient was evaluated in Emergency Department today for the symptoms described in the history of present illness. He/she was evaluated in the context of the global COVID-19 pandemic, which necessitated consideration that the patient might be at risk for infection with the SARS-CoV-2 virus that causes COVID-19.  Institutional protocols and algorithms that pertain to the evaluation of patients at risk for COVID-19 are in a state of rapid change based on information released by regulatory bodies including the CDC and federal and state organizations. These policies and algorithms were followed during the patient's care in the ED.  As part of my medical decision making, I reviewed the following data within the [facility] notes reviewed and incorporated, Labs reviewed, notes from prior ED visits and [place] Controlled Substance Database   ____________________________________________   FINAL CLINICAL IMPRESSION(S) / ED DIAGNOSES  Final diagnoses:  Anaphylaxis, initial encounter      NEW MEDICATIONS STARTED DURING THIS VISIT:  New Prescriptions   EPINEPHRINE 0.3 MG/0.3 ML IJ SOAJ INJECTION    Inject 0.3 mg into the muscle as needed for anaphylaxis.   PREDNISONE (DELTASONE) 20 MG TABLET    Take 2 tablets (40 mg total) by mouth daily for 4 days.     Note:  This document was prepared using Dragon voice recognition software and may include unintentional dictation errors.    Merlyn Lot, MD 02/13/21 1116

## 2021-08-08 ENCOUNTER — Emergency Department
Admission: EM | Admit: 2021-08-08 | Discharge: 2021-08-08 | Disposition: A | Discharge status: Home / Self Care | Payer: Medicaid Other | Attending: Emergency Medicine | Admitting: Emergency Medicine

## 2021-08-08 ENCOUNTER — Emergency Department: Payer: Medicaid Other

## 2021-08-08 ENCOUNTER — Other Ambulatory Visit: Payer: Self-pay

## 2021-08-08 DIAGNOSIS — R630 Anorexia: Secondary | ICD-10-CM | POA: Insufficient documentation

## 2021-08-08 DIAGNOSIS — R079 Chest pain, unspecified: Secondary | ICD-10-CM | POA: Diagnosis present

## 2021-08-08 DIAGNOSIS — B349 Viral infection, unspecified: Secondary | ICD-10-CM | POA: Diagnosis not present

## 2021-08-08 DIAGNOSIS — R0789 Other chest pain: Secondary | ICD-10-CM

## 2021-08-08 LAB — TROPONIN I (HIGH SENSITIVITY): Troponin I (High Sensitivity): <2 ng/L (ref <18)

## 2021-08-08 LAB — CBC
HCT: 42.7 % (ref 36.0–46.0)
Hemoglobin: 13.6 g/dL (ref 12.0–15.0)
MCH: 29.6 pg (ref 26.0–34.0)
MCHC: 31.9 g/dL (ref 30.0–36.0)
MCV: 93 fL (ref 80.0–100.0)
Platelets: 261 10*3/uL (ref 150–400)
RBC: 4.59 MIL/uL (ref 3.87–5.11)
RDW: 13.1 % (ref 11.5–15.5)
WBC: 7.9 10*3/uL (ref 4.0–10.5)
nRBC: 0 % (ref 0.0–0.2)

## 2021-08-08 LAB — BASIC METABOLIC PANEL
Anion gap: 4 — ABNORMAL LOW (ref 5–15)
BUN: 14 mg/dL (ref 6–20)
CO2: 29 mmol/L (ref 22–32)
Calcium: 9.8 mg/dL (ref 8.9–10.3)
Chloride: 108 mmol/L (ref 98–111)
Creatinine, Ser: 0.9 mg/dL (ref 0.44–1.00)
GFR, Estimated: >60 mL/min (ref >60)
Glucose, Bld: 99 mg/dL (ref 70–99)
Potassium: 4 mmol/L (ref 3.5–5.1)
Sodium: 141 mmol/L (ref 135–145)

## 2021-08-08 LAB — D-DIMER, QUANTITATIVE: D-Dimer, Quant: 0.4 ug/mL-FEU (ref 0.00–0.50)

## 2021-08-08 NOTE — ED Triage Notes (Signed)
Pt states that she started having chest pain around 0730 today states that she has felt groggy and the pain is intermittent, pt states that she is having sharp, stabbing pain, with some sob, pt denies hx of blood clot

## 2021-08-08 NOTE — ED Provider Triage Note (Signed)
Emergency Medicine Provider Triage Evaluation Note  Nichole Hall , a 56 y.o. female  was evaluated in triage.  Pt complains of left-sided chest pain, sharp.  Pain present upon awakening this morning.  She feels short of breath.  No cardiac history.  Review of Systems  Positive: Chest pain, shortness of breath Negative: Fevers, cough  Physical Exam  BP (!) 128/103 (BP Location: Left Arm)   Pulse 65   Temp 98.1 F (36.7 C) (Oral)   Resp 18   SpO2 100%  Gen:   Awake, no distress   Resp:  Normal effort  MSK:   Moves extremities without difficulty  Other:    Medical Decision Making  Medically screening exam initiated at 5:18 PM.  Appropriate orders placed.  Onesti Christensen was informed that the remainder of the evaluation will be completed by another provider, this initial triage assessment does not replace that evaluation, and the importance of remaining in the ED until their evaluation is complete.     Duanne Guess, PA-C 08/08/21 1719

## 2021-08-08 NOTE — ED Provider Notes (Signed)
[facility] Provider Note    Event Date/Time   First MD Initiated Contact with Patient 08/08/21 1837     (approximate)   History   No chief complaint on file.   HPI  Nichole Hall is a 56 y.o. female who presents to the ED for evaluation of No chief complaint on file.   Patient presents to the ED for evaluation of chest pain that started this morning when she awoke at her normal time.  She reports feeling normal yesterday, but awakened this morning with a mild headache, chest pain, malaise and lesser appetite.  Reports intermittent sharp left-sided chest pain that lasts a matter of minutes at a time before self resolving.  Reports she feels somewhat short of breath because the pain is worse when she touches the area or takes a big breath.  No syncope, fever, trauma, domino pain, emesis   Physical Exam   Triage Vital Signs: ED Triage Vitals  Enc Vitals Group     BP 08/08/21 1715 (!) 128/103     Pulse Rate 08/08/21 1715 65     Resp 08/08/21 1715 18     Temp 08/08/21 1715 98.1 F (36.7 C)     Temp Source 08/08/21 1715 Oral     SpO2 08/08/21 1715 100 %     Weight 08/08/21 1727 170 lb (77.1 kg)     Height 08/08/21 1727 '5\' 9"'$  (1.753 m)     Head Circumference --      Peak Flow --      Pain Score 08/08/21 1727 4     Pain Loc --      Pain Edu? --      Excl. in [place]? --     Most recent vital signs: Vitals:   08/08/21 1715  BP: (!) 128/103  Pulse: 65  Resp: 18  Temp: 98.1 F (36.7 C)  SpO2: 100%    General: Awake, no distress.  Well-appearing and conversational. CV:  Good peripheral perfusion. RRR Resp:  Normal effort.  CTA B Abd:  No distention.  Soft and benign MSK:  No deformity noted.  Neuro:  No focal deficits appreciated. Other:  Chest pain is reproducible with tenderness on palpation.  No overlying skin changes or signs of trauma.   ED Results / Procedures / Treatments   Labs (all labs ordered are listed, but only abnormal  results are displayed) Labs Reviewed  BASIC METABOLIC PANEL - Abnormal; Notable for the following components:      Result Value   Anion gap 4 (*)    All other components within normal limits  CBC  D-DIMER, QUANTITATIVE  TROPONIN I (HIGH SENSITIVITY)  TROPONIN I (HIGH SENSITIVITY)    EKG Sinus rhythm, rate of 58 bpm.  Normal axis and intervals.  No clear evidence of acute ischemia. Nonspecific/low amplitude ST changes to lead V4 and V5 appear similar to EKG from December.  RADIOLOGY CXR interpreted by me without evidence of acute cardiopulmonary pathology.  Official radiology report(s): DG Chest 2 View  Result Date: 08/08/2021 CLINICAL DATA:  Provided history: Chest pain. EXAM: CHEST - 2 VIEW COMPARISON:  Chest radiographs 08/07/2020 and earlier. FINDINGS: Heart size within normal limits. No appreciable airspace consolidation. No evidence of pleural effusion or pneumothorax. No acute bony abnormality identified. Surgical clips within the right upper quadrant of the abdomen. IMPRESSION: No evidence of active cardiopulmonary disease. Electronically Signed   By: Kellie Simmering D.O.   On: 08/08/2021 18:18  PROCEDURES and INTERVENTIONS:  .1-3 Lead EKG Interpretation Performed by: Vladimir Crofts, MD Authorized by: Vladimir Crofts, MD     Interpretation: normal     ECG rate:  68   ECG rate assessment: normal     Rhythm: sinus rhythm     Ectopy: none     Conduction: normal    Medications - No data to display   IMPRESSION / MDM / [place] / ED COURSE  I reviewed the triage vital signs and the nursing notes.  Differential diagnosis includes, but is not limited to, ACS, PTX, PNA, muscle strain/spasm, PE, dissection  {Patient presents with symptoms of an acute illness or injury that is potentially life-threatening.  56 year old female presents to the ED with atypical chest pains and suitable for outpatient management.  She looks systemically well to me and has reproducible pain  on exam.  Otherwise normal exam.  EKG is nonischemic, troponin is negative, D-dimer is negative and CBC/BMP are normal.  Less likely NSTEMI or any severe cardiopulmonary pathology.  We will discharge with return precautions.  Clinical Course as of 08/08/21 1916  Thu Aug 08, 2021  1914 Discussed plan of care with the patient.  She does not want to stay for second troponin.  She looks well and I think ACS is unlikely.  We discussed this and she is agreeable  [DS]    Clinical Course User Index [DS] Vladimir Crofts, MD     FINAL CLINICAL IMPRESSION(S) / ED DIAGNOSES   Final diagnoses:  Other chest pain  Viral syndrome     Rx / DC Orders   ED Discharge Orders     None        Note:  This document was prepared using Dragon voice recognition software and may include unintentional dictation errors.   Vladimir Crofts, MD 08/08/21 [phone]

## 2022-03-10 HISTORY — PX: TENODESIS BICEPS TENDON AT ELBOW: SUR1328

## 2022-08-06 IMAGING — CR DG CHEST 2V
1 series · 2 of 2 positions shown · non-contrast
Comparison: Chest radiographs 08/07/2020 and earlier.

CLINICAL DATA: Provided history: Chest pain.

EXAM:
CHEST - 2 VIEW

[Series 1: dg chest 2 view · 0.14mm/px · 2 of 2 slices shown]
[im 1/2]
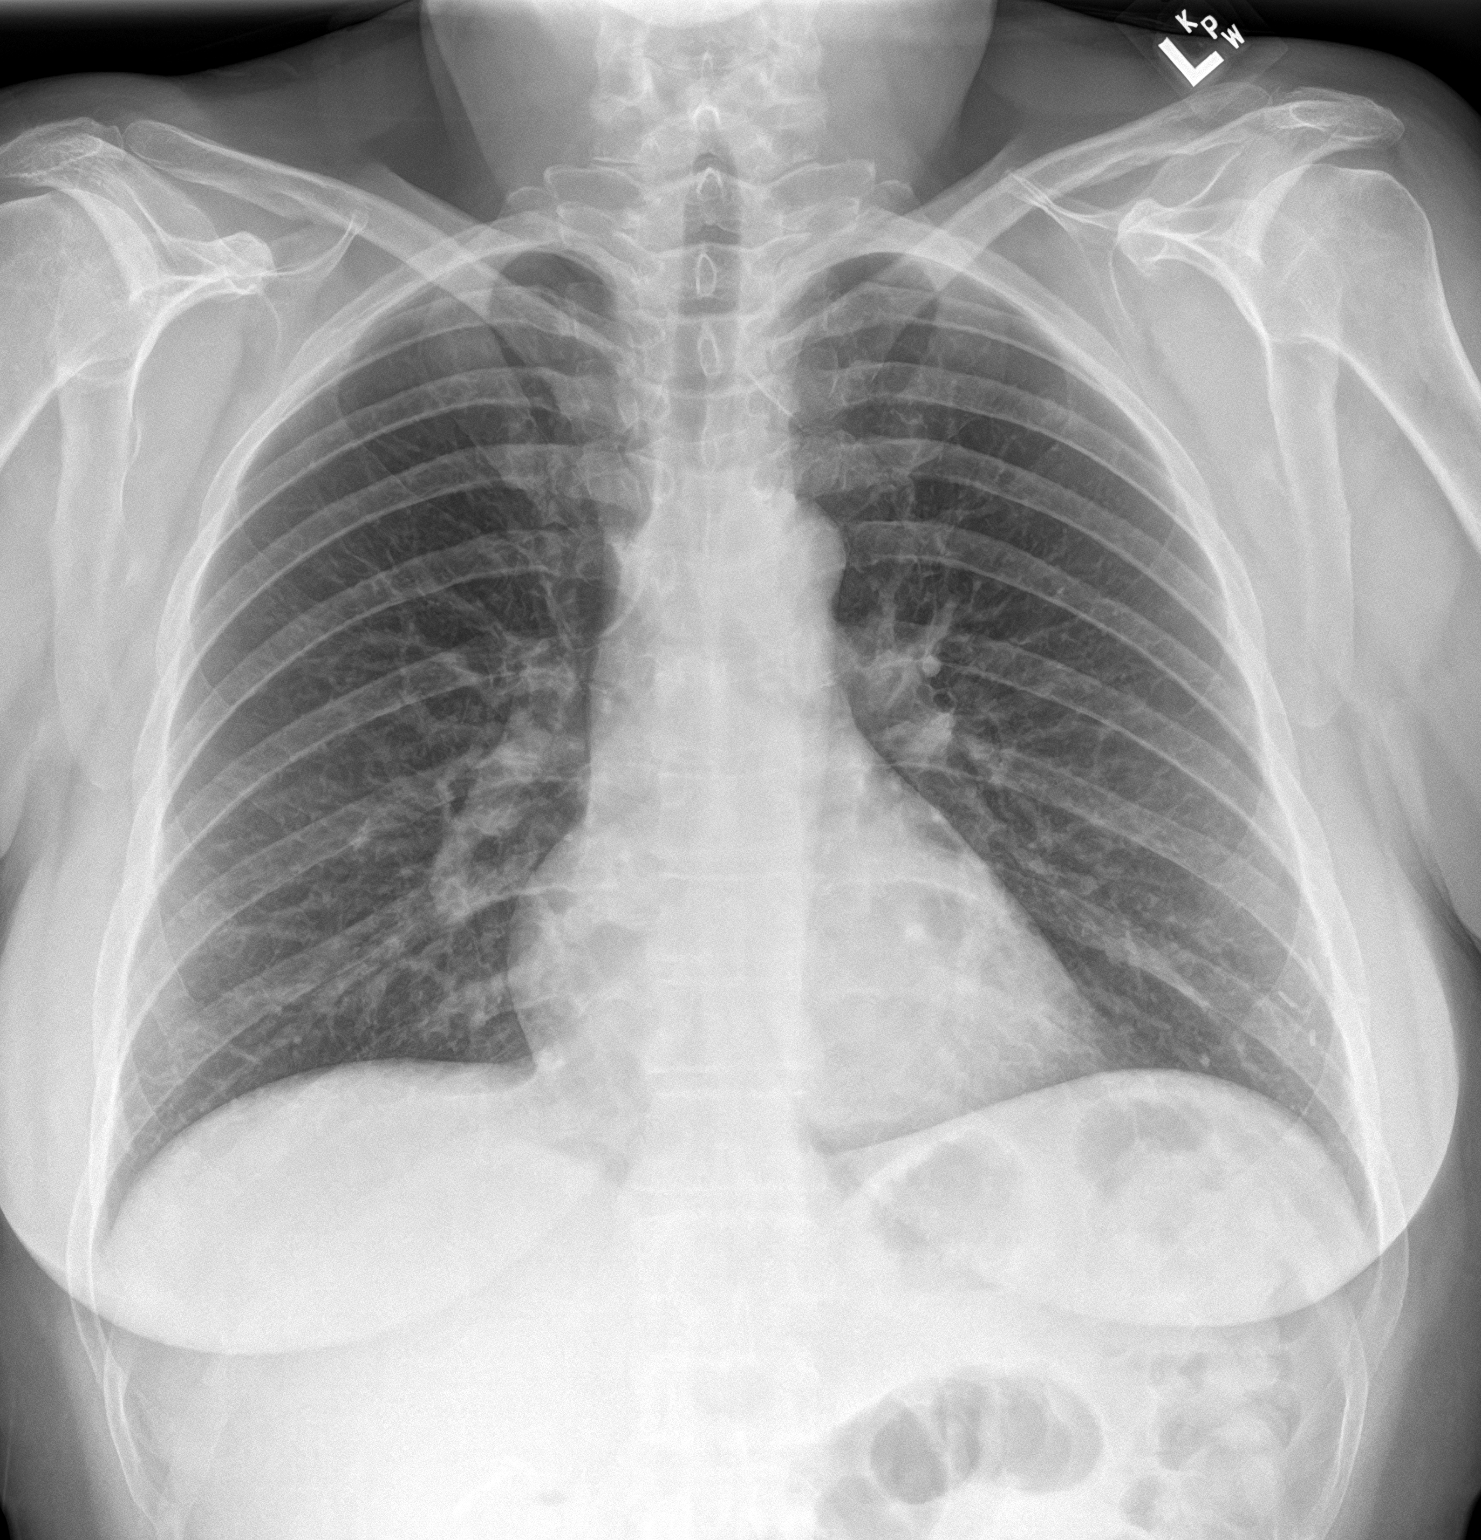
[im 2/2]
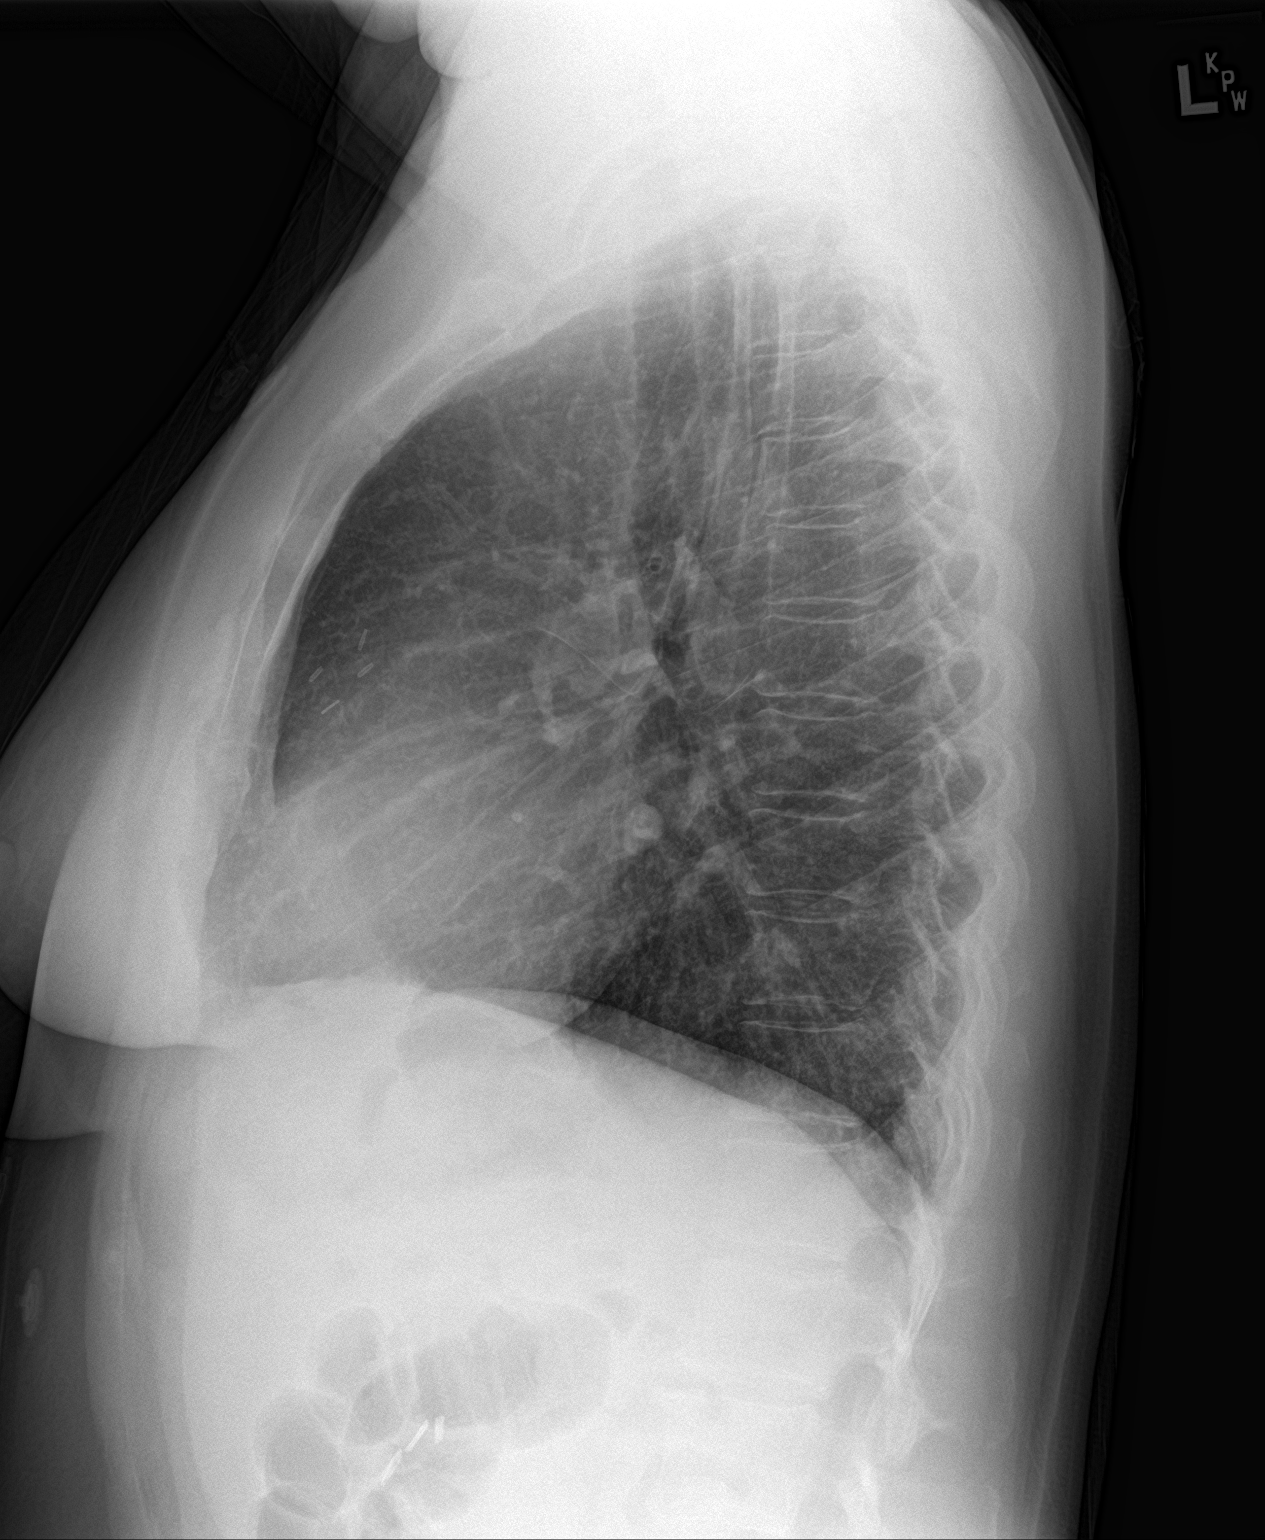

[2 of 2 positions shown; findings below may reference images not displayed]

FINDINGS: Heart size within normal limits. No appreciable airspace
consolidation. No evidence of pleural effusion or pneumothorax. No
acute bony abnormality identified. Surgical clips within the right
upper quadrant of the abdomen.
IMPRESSION: No evidence of active cardiopulmonary disease.

## 2023-02-16 ENCOUNTER — Other Ambulatory Visit: Payer: Self-pay | Admitting: Family Medicine

## 2023-02-16 DIAGNOSIS — Z1231 Encounter for screening mammogram for malignant neoplasm of breast: Secondary | ICD-10-CM

## 2023-03-11 DIAGNOSIS — M1611 Unilateral primary osteoarthritis, right hip: Secondary | ICD-10-CM

## 2023-03-11 HISTORY — DX: Unilateral primary osteoarthritis, right hip: M16.11

## 2023-03-13 ENCOUNTER — Ambulatory Visit
Admission: RE | Admit: 2023-03-13 | Discharge: 2023-03-13 | Disposition: A | Discharge status: Home / Self Care | Payer: Medicaid Other | Source: Ambulatory Visit | Attending: Family Medicine | Admitting: Family Medicine

## 2023-03-13 DIAGNOSIS — Z1231 Encounter for screening mammogram for malignant neoplasm of breast: Secondary | ICD-10-CM | POA: Insufficient documentation

## 2023-03-16 ENCOUNTER — Inpatient Hospital Stay
Admission: RE | Admit: 2023-03-16 | Discharge: 2023-03-16 | Disposition: A | Discharge status: Home / Self Care | Payer: Self-pay | Source: Ambulatory Visit | Attending: Family Medicine | Admitting: Family Medicine

## 2023-03-16 ENCOUNTER — Other Ambulatory Visit: Payer: Self-pay | Admitting: *Deleted

## 2023-03-16 DIAGNOSIS — Z1231 Encounter for screening mammogram for malignant neoplasm of breast: Secondary | ICD-10-CM

## 2023-03-18 ENCOUNTER — Other Ambulatory Visit: Payer: Self-pay | Admitting: Orthopedic Surgery

## 2023-03-31 ENCOUNTER — Other Ambulatory Visit: Payer: Self-pay

## 2023-03-31 ENCOUNTER — Encounter
Admission: RE | Admit: 2023-03-31 | Discharge: 2023-03-31 | Disposition: A | Discharge status: Home / Self Care | Payer: Medicaid Other | Source: Ambulatory Visit | Attending: Orthopedic Surgery | Admitting: Orthopedic Surgery

## 2023-03-31 DIAGNOSIS — Z01818 Encounter for other preprocedural examination: Secondary | ICD-10-CM | POA: Insufficient documentation

## 2023-03-31 DIAGNOSIS — Z01812 Encounter for preprocedural laboratory examination: Secondary | ICD-10-CM

## 2023-03-31 DIAGNOSIS — Z01810 Encounter for preprocedural cardiovascular examination: Secondary | ICD-10-CM | POA: Diagnosis not present

## 2023-03-31 HISTORY — DX: Prediabetes: R73.03

## 2023-03-31 HISTORY — DX: Other chest pain: R07.89

## 2023-03-31 HISTORY — DX: Polyp of colon: K63.5

## 2023-03-31 LAB — CBC WITH DIFFERENTIAL/PLATELET
Abs Immature Granulocytes: 0.02 10*3/uL (ref 0.00–0.07)
Basophils Absolute: 0.1 10*3/uL (ref 0.0–0.1)
Basophils Relative: 1 %
Eosinophils Absolute: 0.2 10*3/uL (ref 0.0–0.5)
Eosinophils Relative: 4 %
HCT: 41.5 % (ref 36.0–46.0)
Hemoglobin: 13.9 g/dL (ref 12.0–15.0)
Immature Granulocytes: 0 %
Lymphocytes Relative: 26 %
Lymphs Abs: 1.6 10*3/uL (ref 0.7–4.0)
MCH: 30.2 pg (ref 26.0–34.0)
MCHC: 33.5 g/dL (ref 30.0–36.0)
MCV: 90 fL (ref 80.0–100.0)
Monocytes Absolute: 0.5 10*3/uL (ref 0.1–1.0)
Monocytes Relative: 8 %
Neutro Abs: 3.7 10*3/uL (ref 1.7–7.7)
Neutrophils Relative %: 61 %
Platelets: 226 10*3/uL (ref 150–400)
RBC: 4.61 MIL/uL (ref 3.87–5.11)
RDW: 12.8 % (ref 11.5–15.5)
WBC: 6.2 10*3/uL (ref 4.0–10.5)
nRBC: 0 % (ref 0.0–0.2)

## 2023-03-31 LAB — URINALYSIS, ROUTINE W REFLEX MICROSCOPIC
Bilirubin Urine: NEGATIVE
Glucose, UA: NEGATIVE mg/dL
Ketones, ur: NEGATIVE mg/dL
Leukocytes,Ua: NEGATIVE
Nitrite: NEGATIVE
Protein, ur: NEGATIVE mg/dL
Specific Gravity, Urine: 1.014 (ref 1.005–1.030)
pH: 5 (ref 5.0–8.0)

## 2023-03-31 LAB — COMPREHENSIVE METABOLIC PANEL
ALT: 28 U/L (ref 0–44)
AST: 31 U/L (ref 15–41)
Albumin: 4.2 g/dL (ref 3.5–5.0)
Alkaline Phosphatase: 60 U/L (ref 38–126)
Anion gap: 8 (ref 5–15)
BUN: 13 mg/dL (ref 6–20)
CO2: 26 mmol/L (ref 22–32)
Calcium: 8.9 mg/dL (ref 8.9–10.3)
Chloride: 102 mmol/L (ref 98–111)
Creatinine, Ser: 0.83 mg/dL (ref 0.44–1.00)
GFR, Estimated: >60 mL/min (ref >60)
Glucose, Bld: 114 mg/dL — ABNORMAL HIGH (ref 70–99)
Potassium: 3.7 mmol/L (ref 3.5–5.1)
Sodium: 136 mmol/L (ref 135–145)
Total Bilirubin: 0.8 mg/dL (ref 0.0–1.2)
Total Protein: 7.4 g/dL (ref 6.5–8.1)

## 2023-03-31 LAB — SURGICAL PCR SCREEN
MRSA, PCR: NEGATIVE
Staphylococcus aureus: NEGATIVE

## 2023-03-31 NOTE — Patient Instructions (Signed)
Your procedure is scheduled on: Monday, January 27 Report to the Registration Desk on the 1st floor of the [facility] To find out your arrival time, please call [phone] between 1PM - 3PM on: Friday, January 24 If your arrival time is 6:00 am, do not arrive before that time as the [facility] entrance doors do not open until 6:00 am.  REMEMBER: Instructions that are not followed completely may result in serious medical risk, up to and including death; or upon the discretion of your surgeon and anesthesiologist your surgery may need to be rescheduled.  Do not eat food after midnight the night before surgery.  No gum chewing or hard candies.  You may however, drink CLEAR liquids up to 2 hours before you are scheduled to arrive for your surgery. Do not drink anything within 2 hours of your scheduled arrival time.  Clear liquids include: - water  - apple juice without pulp - gatorade (not RED colors) - black coffee or tea (Do NOT add milk or creamers to the coffee or tea) Do NOT drink anything that is not on this list.  In addition, your doctor has ordered for you to drink the provided:  Ensure Pre-Surgery Clear Carbohydrate Drink  Drinking this carbohydrate drink up to two hours before surgery helps to reduce insulin resistance and improve patient outcomes. Please complete drinking 2 hours before scheduled arrival time.  One week prior to surgery: starting January 20 Stop Anti-inflammatories (NSAIDS) such as Advil, Aleve, Ibuprofen, Motrin, Naproxen, Naprosyn and Aspirin based products such as Excedrin, Goody's Powder, BC Powder. Stop ANY OVER THE COUNTER supplements until after surgery.  You may however, continue to take Tylenol if needed for pain up until the day of surgery.  ON THE DAY OF SURGERY DO NOT TAKE ANY MEDICATIONS  No Alcohol for 24 hours before or after surgery.  No Smoking including e-cigarettes for 24 hours before surgery.  No chewable tobacco products for at  least 6 hours before surgery.  No nicotine patches on the day of surgery.  Do not use any "recreational" drugs for at least a week (preferably 2 weeks) before your surgery.  Please be advised that the combination of cocaine and anesthesia may have negative outcomes, up to and including death. If you test positive for cocaine, your surgery will be cancelled.  On the morning of surgery brush your teeth with toothpaste and water, you may rinse your mouth with mouthwash if you wish. Do not swallow any toothpaste or mouthwash.  Use CHG Soap as directed on instruction sheet.  Do not wear jewelry, make-up, hairpins, clips or nail polish.  For welded (permanent) jewelry: bracelets, anklets, waist bands, etc.  Please have this removed prior to surgery.  If it is not removed, there is a chance that hospital personnel will need to cut it off on the day of surgery.  Do not wear lotions, powders, or perfumes.   Do not shave body hair from the neck down 48 hours before surgery.  Contact lenses, hearing aids and dentures may not be worn into surgery.  Do not bring valuables to the hospital. [facility] is not responsible for any missing/lost belongings or valuables.   Notify your doctor if there is any change in your medical condition (cold, fever, infection).  Wear comfortable clothing (specific to your surgery type) to the hospital.  After surgery, you can help prevent lung complications by doing breathing exercises.  Take deep breaths and cough every 1-2 hours. Your doctor  may order a device called an Incentive Spirometer to help you take deep breaths.  If you are being admitted to the hospital overnight, leave your suitcase in the car. After surgery it may be brought to your room.  In case of increased patient census, it may be necessary for you, the patient, to continue your postoperative care in the Same Day Surgery department.  If you are being discharged the day of surgery, you will not  be allowed to drive home. You will need a responsible individual to drive you home and stay with you for 24 hours after surgery.   If you are taking public transportation, you will need to have a responsible individual with you.  Please call the Pre-admissions Testing Dept. at [phone] if you have any questions about these instructions.  Surgery Visitation Policy:  Patients having surgery or a procedure may have two visitors.  Children under the age of 13 must have an adult with them who is not the patient.  Temporary Visitor Restrictions Due to increasing cases of flu, RSV and COVID-19: Children ages 67 and under will not be able to visit patients in [facility] hospitals under most circumstances.  Inpatient Visitation:    Visiting hours are 7 a.m. to 8 p.m. Up to four visitors are allowed at one time in a patient room. The visitors may rotate out with other people during the day.  One visitor age 5 or older may stay with the patient overnight and must be in the room by 8 p.m.      Pre-operative 5 CHG Bath Instructions   You can play a key role in reducing the risk of infection after surgery. Your skin needs to be as free of germs as possible. You can reduce the number of germs on your skin by washing with CHG (chlorhexidine gluconate) soap before surgery. CHG is an antiseptic soap that kills germs and continues to kill germs even after washing.   DO NOT use if you have an allergy to chlorhexidine/CHG or antibacterial soaps. If your skin becomes reddened or irritated, stop using the CHG and notify one of our RNs at [phone].   Please shower with the CHG soap starting 4 days before surgery using the following schedule:     Please keep in mind the following:  DO NOT shave, including legs and underarms, starting the day of your first shower.   You may shave your face at any point before/day of surgery.  Place clean sheets on your bed the day you start using CHG  soap. Use a clean washcloth (not used since being washed) for each shower. DO NOT sleep with pets once you start using the CHG.   CHG Shower Instructions:  If you choose to wash your hair and private area, wash first with your normal shampoo/soap.  After you use shampoo/soap, rinse your hair and body thoroughly to remove shampoo/soap residue.  Turn the water OFF and apply about 3 tablespoons (45 ml) of CHG soap to a CLEAN washcloth.  Apply CHG soap ONLY FROM YOUR NECK DOWN TO YOUR TOES (washing for 3-5 minutes)  DO NOT use CHG soap on face, private areas, open wounds, or sores.  Pay special attention to the area where your surgery is being performed.  If you are having back surgery, having someone wash your back for you may be helpful. Wait 2 minutes after CHG soap is applied, then you may rinse off the CHG soap.  Pat dry with a  clean towel  Put on clean clothes/pajamas   If you choose to wear lotion, please use ONLY the CHG-compatible lotions on the back of this paper.     Additional instructions for the day of surgery: DO NOT APPLY any lotions, deodorants, cologne, or perfumes.   Put on clean/comfortable clothes.  Brush your teeth.  Ask your nurse before applying any prescription medications to the skin.      CHG Compatible Lotions   Aveeno Moisturizing lotion  Cetaphil Moisturizing Cream  Cetaphil Moisturizing Lotion  Clairol Herbal Essence Moisturizing Lotion, Dry Skin  Clairol Herbal Essence Moisturizing Lotion, Extra Dry Skin  Clairol Herbal Essence Moisturizing Lotion, Normal Skin  Curel Age Defying Therapeutic Moisturizing Lotion with Alpha Hydroxy  Curel Extreme Care Body Lotion  Curel Soothing Hands Moisturizing Hand Lotion  Curel Therapeutic Moisturizing Cream, Fragrance-Free  Curel Therapeutic Moisturizing Lotion, Fragrance-Free  Curel Therapeutic Moisturizing Lotion, Original Formula  Eucerin Daily Replenishing Lotion  Eucerin Dry Skin Therapy Plus Alpha Hydroxy  Crme  Eucerin Dry Skin Therapy Plus Alpha Hydroxy Lotion  Eucerin Original Crme  Eucerin Original Lotion  Eucerin Plus Crme Eucerin Plus Lotion  Eucerin TriLipid Replenishing Lotion  Keri Anti-Bacterial Hand Lotion  Keri Deep Conditioning Original Lotion Dry Skin Formula Softly Scented  Keri Deep Conditioning Original Lotion, Fragrance Free Sensitive Skin Formula  Keri Lotion Fast Absorbing Fragrance Free Sensitive Skin Formula  Keri Lotion Fast Absorbing Softly Scented Dry Skin Formula  Keri Original Lotion  Keri Skin Renewal Lotion Keri Silky Smooth Lotion  Keri Silky Smooth Sensitive Skin Lotion  Nivea Body Creamy Conditioning Oil  Nivea Body Extra Enriched Lotion  Nivea Body Original Lotion  Nivea Body Sheer Moisturizing Lotion Nivea Crme  Nivea Skin Firming Lotion  NutraDerm 30 Skin Lotion  NutraDerm Skin Lotion  NutraDerm Therapeutic Skin Cream  NutraDerm Therapeutic Skin Lotion  ProShield Protective Hand Cream  Provon moisturizing lotion    Preoperative Educational Videos for Total Hip, Knee and Shoulder Replacements  To better prepare for surgery, please view our videos that explain the physical activity and discharge planning required to have the best surgical recovery at [facility].  [email or web address]  Questions? Call [phone] or email [email or web address]

## 2023-04-03 LAB — TYPE AND SCREEN
ABO/RH(D): O NEG
Antibody Screen: NEGATIVE

## 2023-04-05 MED ORDER — ORAL CARE MOUTH RINSE: 15.0000 mL | Freq: Once | OROMUCOSAL | Status: AC

## 2023-04-05 MED ORDER — CEFAZOLIN SODIUM-DEXTROSE 2-4 GM/100ML-% IV SOLN
2.0000 g | INTRAVENOUS | Status: AC
  Administered 2023-04-06: 2 g via INTRAVENOUS

## 2023-04-05 MED ORDER — TRANEXAMIC ACID-NACL 1000-0.7 MG/100ML-% IV SOLN
1000.0000 mg | INTRAVENOUS | Status: AC
  Administered 2023-04-06 (×2): 1000 mg via INTRAVENOUS

## 2023-04-05 MED ORDER — DEXAMETHASONE SODIUM PHOSPHATE 10 MG/ML IJ SOLN
8.0000 mg | Freq: Once | INTRAMUSCULAR | Status: AC
  Administered 2023-04-06: 8 mg via INTRAVENOUS

## 2023-04-05 MED ORDER — CHLORHEXIDINE GLUCONATE 0.12 % MT SOLN
15.0000 mL | Freq: Once | OROMUCOSAL | Status: AC
  Administered 2023-04-06: 15 mL via OROMUCOSAL

## 2023-04-05 MED ORDER — LACTATED RINGERS IV SOLN: INTRAVENOUS | Status: DC

## 2023-04-06 ENCOUNTER — Ambulatory Visit
Admission: RE | Admit: 2023-04-06 | Discharge: 2023-04-07 | Disposition: A | Discharge status: Home Health | Payer: Medicaid Other | Attending: Orthopedic Surgery | Admitting: Orthopedic Surgery

## 2023-04-06 ENCOUNTER — Ambulatory Visit: Payer: Self-pay

## 2023-04-06 ENCOUNTER — Ambulatory Visit: Payer: Medicaid Other | Admitting: Urgent Care

## 2023-04-06 ENCOUNTER — Other Ambulatory Visit: Payer: Self-pay

## 2023-04-06 ENCOUNTER — Ambulatory Visit: Payer: Medicaid Other

## 2023-04-06 ENCOUNTER — Encounter: Payer: Self-pay | Admitting: Orthopedic Surgery

## 2023-04-06 ENCOUNTER — Encounter
Admission: RE | Disposition: A | Discharge status: Home Health | Payer: Self-pay | Source: Home / Self Care | Attending: Orthopedic Surgery

## 2023-04-06 DIAGNOSIS — M1611 Unilateral primary osteoarthritis, right hip: Secondary | ICD-10-CM | POA: Insufficient documentation

## 2023-04-06 DIAGNOSIS — K219 Gastro-esophageal reflux disease without esophagitis: Secondary | ICD-10-CM | POA: Insufficient documentation

## 2023-04-06 DIAGNOSIS — R7303 Prediabetes: Secondary | ICD-10-CM | POA: Diagnosis not present

## 2023-04-06 DIAGNOSIS — Z791 Long term (current) use of non-steroidal anti-inflammatories (NSAID): Secondary | ICD-10-CM | POA: Insufficient documentation

## 2023-04-06 DIAGNOSIS — Z01812 Encounter for preprocedural laboratory examination: Secondary | ICD-10-CM

## 2023-04-06 DIAGNOSIS — F419 Anxiety disorder, unspecified: Secondary | ICD-10-CM | POA: Diagnosis not present

## 2023-04-06 DIAGNOSIS — Z96641 Presence of right artificial hip joint: Secondary | ICD-10-CM | POA: Diagnosis present

## 2023-04-06 DIAGNOSIS — Z87891 Personal history of nicotine dependence: Secondary | ICD-10-CM | POA: Insufficient documentation

## 2023-04-06 DIAGNOSIS — D62 Acute posthemorrhagic anemia: Secondary | ICD-10-CM | POA: Diagnosis not present

## 2023-04-06 HISTORY — PX: TOTAL HIP ARTHROPLASTY: SHX124

## 2023-04-06 LAB — ABO/RH: ABO/RH(D): O NEG

## 2023-04-06 SURGERY — ARTHROPLASTY, HIP, TOTAL, ANTERIOR APPROACH
Anesthesia: Spinal | Site: Hip | Laterality: Right

## 2023-04-06 MED ORDER — KETOROLAC TROMETHAMINE 15 MG/ML IJ SOLN
INTRAMUSCULAR | Status: AC
  Filled 2023-04-06: qty 1

## 2023-04-06 MED ORDER — HYDROMORPHONE HCL 1 MG/ML IJ SOLN
INTRAMUSCULAR | Status: AC
  Filled 2023-04-06: qty 0.5

## 2023-04-06 MED ORDER — KETOROLAC TROMETHAMINE 30 MG/ML IJ SOLN
INTRAMUSCULAR | Status: DC | PRN
  Administered 2023-04-06: 30 mg via INTRAVENOUS

## 2023-04-06 MED ORDER — HYDROCODONE-ACETAMINOPHEN 5-325 MG PO TABS
ORAL_TABLET | ORAL | Status: AC
  Filled 2023-04-06: qty 2

## 2023-04-06 MED ORDER — ACETAMINOPHEN 10 MG/ML IV SOLN
INTRAVENOUS | Status: DC | PRN
  Administered 2023-04-06: 1000 mg via INTRAVENOUS

## 2023-04-06 MED ORDER — ACETAMINOPHEN 10 MG/ML IV SOLN
INTRAVENOUS | Status: AC
  Filled 2023-04-06: qty 100

## 2023-04-06 MED ORDER — CHLORHEXIDINE GLUCONATE 0.12 % MT SOLN
OROMUCOSAL | Status: AC
  Filled 2023-04-06: qty 15

## 2023-04-06 MED ORDER — SODIUM CHLORIDE (PF) 0.9 % IJ SOLN
INTRAMUSCULAR | Status: DC | PRN
  Administered 2023-04-06: 50 mL

## 2023-04-06 MED ORDER — PHENOL 1.4 % MT LIQD: 1.0000 | OROMUCOSAL | Status: DC | PRN

## 2023-04-06 MED ORDER — DOCUSATE SODIUM 100 MG PO CAPS
ORAL_CAPSULE | ORAL | Status: AC
  Filled 2023-04-06: qty 1

## 2023-04-06 MED ORDER — LACTATED RINGERS IV BOLUS
1000.0000 mL | Freq: Once | INTRAVENOUS | Status: AC
  Administered 2023-04-06: 1000 mL via INTRAVENOUS

## 2023-04-06 MED ORDER — TRANEXAMIC ACID-NACL 1000-0.7 MG/100ML-% IV SOLN
INTRAVENOUS | Status: AC
  Filled 2023-04-06: qty 100

## 2023-04-06 MED ORDER — PROPOFOL 1000 MG/100ML IV EMUL
INTRAVENOUS | Status: AC
Start: 2023-04-06 — End: ?
  Filled 2023-04-06: qty 100

## 2023-04-06 MED ORDER — SURGIPHOR WOUND IRRIGATION SYSTEM - OPTIME: TOPICAL | Status: DC | PRN

## 2023-04-06 MED ORDER — SURGIFLO WITH THROMBIN (HEMOSTATIC MATRIX KIT) OPTIME
TOPICAL | Status: DC | PRN
  Administered 2023-04-06: 1 via TOPICAL

## 2023-04-06 MED ORDER — BUPIVACAINE-EPINEPHRINE (PF) 0.25% -1:200000 IJ SOLN
INTRAMUSCULAR | Status: AC
  Filled 2023-04-06: qty 30

## 2023-04-06 MED ORDER — PANTOPRAZOLE SODIUM 40 MG PO TBEC
DELAYED_RELEASE_TABLET | ORAL | Status: AC
  Filled 2023-04-06: qty 1

## 2023-04-06 MED ORDER — KETOROLAC TROMETHAMINE 15 MG/ML IJ SOLN
7.5000 mg | Freq: Four times a day (QID) | INTRAMUSCULAR | Status: DC
  Administered 2023-04-06 – 2023-04-07 (×3): 7.5 mg via INTRAVENOUS

## 2023-04-06 MED ORDER — BUPIVACAINE LIPOSOME 1.3 % IJ SUSP
INTRAMUSCULAR | Status: AC
  Filled 2023-04-06: qty 20

## 2023-04-06 MED ORDER — CEFAZOLIN SODIUM-DEXTROSE 2-4 GM/100ML-% IV SOLN
INTRAVENOUS | Status: AC
  Filled 2023-04-06: qty 100

## 2023-04-06 MED ORDER — TRAMADOL HCL 50 MG PO TABS
ORAL_TABLET | ORAL | Status: AC
  Filled 2023-04-06: qty 1

## 2023-04-06 MED ORDER — MIDAZOLAM HCL 5 MG/5ML IJ SOLN
INTRAMUSCULAR | Status: DC | PRN
  Administered 2023-04-06: 2 mg via INTRAVENOUS

## 2023-04-06 MED ORDER — LIDOCAINE HCL (PF) 2 % IJ SOLN
INTRAMUSCULAR | Status: AC
  Filled 2023-04-06: qty 5

## 2023-04-06 MED ORDER — ACETAMINOPHEN 325 MG PO TABS: 325.0000 mg | ORAL_TABLET | Freq: Four times a day (QID) | ORAL | Status: DC | PRN

## 2023-04-06 MED ORDER — ACETAMINOPHEN 500 MG PO TABS
ORAL_TABLET | ORAL | Status: AC
  Filled 2023-04-06: qty 2

## 2023-04-06 MED ORDER — SODIUM CHLORIDE FLUSH 0.9 % IV SOLN
INTRAVENOUS | Status: AC
  Filled 2023-04-06: qty 10

## 2023-04-06 MED ORDER — MIDAZOLAM HCL 2 MG/2ML IJ SOLN
INTRAMUSCULAR | Status: AC
Start: 2023-04-06 — End: ?
  Filled 2023-04-06: qty 2

## 2023-04-06 MED ORDER — PHENYLEPHRINE HCL-NACL 20-0.9 MG/250ML-% IV SOLN
INTRAVENOUS | Status: DC | PRN
  Administered 2023-04-06: 40 ug/min via INTRAVENOUS

## 2023-04-06 MED ORDER — OXYCODONE HCL 5 MG/5ML PO SOLN: 5.0000 mg | Freq: Once | ORAL | Status: DC | PRN

## 2023-04-06 MED ORDER — ONDANSETRON HCL 4 MG/2ML IJ SOLN
INTRAMUSCULAR | Status: AC
  Filled 2023-04-06: qty 2

## 2023-04-06 MED ORDER — CEFAZOLIN SODIUM-DEXTROSE 2-4 GM/100ML-% IV SOLN
2.0000 g | Freq: Four times a day (QID) | INTRAVENOUS | Status: AC
  Administered 2023-04-06 (×2): 2 g via INTRAVENOUS

## 2023-04-06 MED ORDER — FENTANYL CITRATE (PF) 100 MCG/2ML IJ SOLN: 25.0000 ug | INTRAMUSCULAR | Status: DC | PRN

## 2023-04-06 MED ORDER — PHENYLEPHRINE 80 MCG/ML (10ML) SYRINGE FOR IV PUSH (FOR BLOOD PRESSURE SUPPORT)
PREFILLED_SYRINGE | INTRAVENOUS | Status: DC | PRN
  Administered 2023-04-06: 80 ug via INTRAVENOUS

## 2023-04-06 MED ORDER — 0.9 % SODIUM CHLORIDE (POUR BTL) OPTIME
TOPICAL | Status: DC | PRN
  Administered 2023-04-06: 500 mL

## 2023-04-06 MED ORDER — ONDANSETRON HCL 4 MG/2ML IJ SOLN: 4.0000 mg | Freq: Four times a day (QID) | INTRAMUSCULAR | Status: DC | PRN

## 2023-04-06 MED ORDER — BUPIVACAINE HCL (PF) 0.5 % IJ SOLN
INTRAMUSCULAR | Status: DC | PRN
  Administered 2023-04-06: 2.7 mL via INTRATHECAL

## 2023-04-06 MED ORDER — HYDROCODONE-ACETAMINOPHEN 5-325 MG PO TABS
1.0000 | ORAL_TABLET | ORAL | Status: DC | PRN
Start: 2023-04-06 — End: 2023-04-07
  Administered 2023-04-06 – 2023-04-07 (×4): 2 via ORAL

## 2023-04-06 MED ORDER — PROPOFOL 500 MG/50ML IV EMUL
INTRAVENOUS | Status: DC | PRN
  Administered 2023-04-06: 100 ug/kg/min via INTRAVENOUS

## 2023-04-06 MED ORDER — EPHEDRINE SULFATE-NACL 50-0.9 MG/10ML-% IV SOSY
PREFILLED_SYRINGE | INTRAVENOUS | Status: DC | PRN
  Administered 2023-04-06: 10 mg via INTRAVENOUS

## 2023-04-06 MED ORDER — FENTANYL CITRATE (PF) 100 MCG/2ML IJ SOLN
INTRAMUSCULAR | Status: DC | PRN
  Administered 2023-04-06: 25 ug via INTRAVENOUS
  Administered 2023-04-06: 50 ug via INTRAVENOUS

## 2023-04-06 MED ORDER — FENTANYL CITRATE (PF) 100 MCG/2ML IJ SOLN
INTRAMUSCULAR | Status: AC
  Filled 2023-04-06: qty 2

## 2023-04-06 MED ORDER — ACETAMINOPHEN 500 MG PO TABS
1000.0000 mg | ORAL_TABLET | Freq: Three times a day (TID) | ORAL | Status: DC
  Administered 2023-04-06: 1000 mg via ORAL

## 2023-04-06 MED ORDER — ONDANSETRON HCL 4 MG/2ML IJ SOLN
4.0000 mg | Freq: Once | INTRAMUSCULAR | Status: AC
  Administered 2023-04-06: 4 mg via INTRAVENOUS

## 2023-04-06 MED ORDER — PROPOFOL 1000 MG/100ML IV EMUL
INTRAVENOUS | Status: AC
  Filled 2023-04-06: qty 100

## 2023-04-06 MED ORDER — OXYCODONE HCL 5 MG PO TABS: 5.0000 mg | ORAL_TABLET | Freq: Once | ORAL | Status: DC | PRN

## 2023-04-06 MED ORDER — TRAMADOL HCL 50 MG PO TABS
50.0000 mg | ORAL_TABLET | Freq: Four times a day (QID) | ORAL | Status: DC | PRN
  Administered 2023-04-06 – 2023-04-07 (×3): 50 mg via ORAL

## 2023-04-06 MED ORDER — DEXAMETHASONE SODIUM PHOSPHATE 10 MG/ML IJ SOLN
INTRAMUSCULAR | Status: AC
  Filled 2023-04-06: qty 1

## 2023-04-06 MED ORDER — MENTHOL 3 MG MT LOZG: 1.0000 | LOZENGE | OROMUCOSAL | Status: DC | PRN

## 2023-04-06 MED ORDER — SODIUM CHLORIDE 0.9 % IV SOLN
INTRAVENOUS | Status: DC
Start: 2023-04-06 — End: 2023-04-07

## 2023-04-06 MED ORDER — ONDANSETRON HCL 4 MG PO TABS
4.0000 mg | ORAL_TABLET | Freq: Four times a day (QID) | ORAL | Status: DC | PRN
Start: 2023-04-06 — End: 2023-04-07
  Administered 2023-04-06: 4 mg via ORAL

## 2023-04-06 MED ORDER — LIDOCAINE HCL (CARDIAC) PF 50 MG/5ML IV SOSY
PREFILLED_SYRINGE | INTRAVENOUS | Status: DC | PRN
  Administered 2023-04-06: 5 mL via INTRAVENOUS

## 2023-04-06 MED ORDER — METOCLOPRAMIDE HCL 10 MG PO TABS
5.0000 mg | ORAL_TABLET | Freq: Three times a day (TID) | ORAL | Status: DC | PRN
Start: 2023-04-06 — End: 2023-04-07
  Administered 2023-04-07: 10 mg via ORAL

## 2023-04-06 MED ORDER — METOCLOPRAMIDE HCL 5 MG/ML IJ SOLN: 5.0000 mg | Freq: Three times a day (TID) | INTRAMUSCULAR | Status: DC | PRN

## 2023-04-06 MED ORDER — DOCUSATE SODIUM 100 MG PO CAPS
100.0000 mg | ORAL_CAPSULE | Freq: Two times a day (BID) | ORAL | Status: DC
  Administered 2023-04-06 – 2023-04-07 (×3): 100 mg via ORAL

## 2023-04-06 MED ORDER — ENOXAPARIN SODIUM 40 MG/0.4ML IJ SOSY
40.0000 mg | PREFILLED_SYRINGE | INTRAMUSCULAR | Status: DC
  Administered 2023-04-07: 40 mg via SUBCUTANEOUS

## 2023-04-06 MED ORDER — ONDANSETRON HCL 4 MG/2ML IJ SOLN
INTRAMUSCULAR | Status: DC | PRN
  Administered 2023-04-06: 4 mg via INTRAVENOUS

## 2023-04-06 MED ORDER — BUPIVACAINE HCL (PF) 0.5 % IJ SOLN
INTRAMUSCULAR | Status: AC
  Filled 2023-04-06: qty 10

## 2023-04-06 MED ORDER — PANTOPRAZOLE SODIUM 40 MG PO TBEC
40.0000 mg | DELAYED_RELEASE_TABLET | Freq: Every day | ORAL | Status: DC
  Administered 2023-04-06 – 2023-04-07 (×2): 40 mg via ORAL

## 2023-04-06 MED ORDER — HYDROMORPHONE HCL 1 MG/ML IJ SOLN
0.5000 mg | INTRAMUSCULAR | Status: DC | PRN
  Administered 2023-04-06 (×2): 0.5 mg via INTRAVENOUS

## 2023-04-06 MED ORDER — ONDANSETRON HCL 4 MG PO TABS
ORAL_TABLET | ORAL | Status: AC
Start: 2023-04-06 — End: ?
  Filled 2023-04-06: qty 1

## 2023-04-06 MED ORDER — PROPOFOL 10 MG/ML IV BOLUS
INTRAVENOUS | Status: AC
  Filled 2023-04-06: qty 20

## 2023-04-06 MED ORDER — PHENYLEPHRINE HCL-NACL 20-0.9 MG/250ML-% IV SOLN
INTRAVENOUS | Status: AC
  Filled 2023-04-06: qty 250

## 2023-04-06 SURGICAL SUPPLY — 62 items
BLADE CLIPPER SURG (BLADE) IMPLANT
BLADE SAGITTAL AGGR TOOTH XLG (BLADE) ×1 IMPLANT
BNDG COHESIVE 6X5 TAN ST LF (GAUZE/BANDAGES/DRESSINGS) ×1 IMPLANT
BRUSH SCRUB EZ PLAIN DRY (MISCELLANEOUS) ×1 IMPLANT
CHLORAPREP W/TINT 26 (MISCELLANEOUS) ×1 IMPLANT
DERMABOND ADVANCED .7 DNX12 (GAUZE/BANDAGES/DRESSINGS) ×1 IMPLANT
DRAPE C-ARM XRAY 36X54 (DRAPES) ×1 IMPLANT
DRAPE SHEET LG 3/4 BI-LAMINATE (DRAPES) ×3 IMPLANT
DRAPE TABLE BACK 80X90 (DRAPES) ×1 IMPLANT
DRSG MEPILEX SACRM 8.7X9.8 (GAUZE/BANDAGES/DRESSINGS) ×1 IMPLANT
DRSG OPSITE POSTOP 4X8 (GAUZE/BANDAGES/DRESSINGS) ×1 IMPLANT
ELECT BLADE 4.0 EZ CLEAN MEGAD (MISCELLANEOUS) ×1
ELECT REM PT RETURN 9FT ADLT (ELECTROSURGICAL) ×1
ELECTRODE BLDE 4.0 EZ CLN MEGD (MISCELLANEOUS) ×1 IMPLANT
ELECTRODE REM PT RTRN 9FT ADLT (ELECTROSURGICAL) ×1 IMPLANT
GLOVE BIO SURGEON STRL SZ8 (GLOVE) ×1 IMPLANT
GLOVE BIOGEL PI IND STRL 8 (GLOVE) ×1 IMPLANT
GLOVE PI ORTHO PRO STRL 7.5 (GLOVE) ×2 IMPLANT
GLOVE PI ORTHO PRO STRL SZ8 (GLOVE) ×2 IMPLANT
GLOVE SURG SYN 7.5 E (GLOVE) ×1
GLOVE SURG SYN 7.5 PF PI (GLOVE) ×1 IMPLANT
GOWN SRG XL LVL 3 NONREINFORCE (GOWNS) ×1 IMPLANT
GOWN STRL REUS W/ TWL LRG LVL3 (GOWN DISPOSABLE) ×1 IMPLANT
GOWN STRL REUS W/ TWL XL LVL3 (GOWN DISPOSABLE) ×1 IMPLANT
HANDLE YANKAUER SUCT OPEN TIP (MISCELLANEOUS) IMPLANT
HEAD CERAMIC FEMORAL 36MM (Head) IMPLANT
HOOD PEEL AWAY T7 (MISCELLANEOUS) ×2 IMPLANT
INSERT 0 DEGREE 36 (Miscellaneous) IMPLANT
IV NS 100ML SINGLE PACK (IV SOLUTION) ×1 IMPLANT
KIT PATIENT CARE HANA TABLE (KITS) ×1 IMPLANT
LIGHT WAVEGUIDE WIDE FLAT (MISCELLANEOUS) ×1 IMPLANT
MANIFOLD NEPTUNE II (INSTRUMENTS) ×1 IMPLANT
MARKER SKIN DUAL TIP RULER LAB (MISCELLANEOUS) ×1 IMPLANT
MAT ABSORB FLUID 56X50 GRAY (MISCELLANEOUS) ×1 IMPLANT
NDL SPNL 20GX3.5 QUINCKE YW (NEEDLE) ×1 IMPLANT
NEEDLE SPNL 20GX3.5 QUINCKE YW (NEEDLE) ×1
NS IRRIG 500ML POUR BTL (IV SOLUTION) ×1 IMPLANT
PACK HIP COMPR (MISCELLANEOUS) ×1 IMPLANT
PAD ARMBOARD 7.5X6 YLW CONV (MISCELLANEOUS) ×1 IMPLANT
PENCIL SMOKE EVACUATOR (MISCELLANEOUS) ×1 IMPLANT
SCREW HEX LP 6.5X20 (Screw) IMPLANT
SCREW HEX LP 6.5X30 (Screw) IMPLANT
SHELL TRIDENT II CLUST 50 (Shell) IMPLANT
SLEEVE SCD COMPRESS KNEE MED (STOCKING) ×1 IMPLANT
SOLUTION IRRIG SURGIPHOR (IV SOLUTION) ×1 IMPLANT
STEM STD OFFSET SZ6 35 (Stem) IMPLANT
SURGIFLO W/THROMBIN 8M KIT (HEMOSTASIS) IMPLANT
SUT BONE WAX W31G (SUTURE) ×1 IMPLANT
SUT ETHIBOND 2 V 37 (SUTURE) ×1 IMPLANT
SUT SILK 0 30XBRD TIE 6 (SUTURE) ×1 IMPLANT
SUT STRATA 1 CT-1 DLB (SUTURE) ×1
SUT STRATAFIX 14 PDO 48 VLT (SUTURE) ×1 IMPLANT
SUT STRATAFIX PDO 1 14 VIOLET (SUTURE) ×1
SUT VIC AB 0 CT1 36 (SUTURE) ×1 IMPLANT
SUT VIC AB 2-0 CT2 27 (SUTURE) ×1 IMPLANT
SUTURE STRATA SPIR 4-0 18 (SUTURE) ×1 IMPLANT
SYR 30ML LL (SYRINGE) ×2 IMPLANT
TAPE MICROFOAM 4IN (TAPE) IMPLANT
TOWEL OR 17X26 4PK STRL BLUE (TOWEL DISPOSABLE) IMPLANT
TRAP FLUID SMOKE EVACUATOR (MISCELLANEOUS) ×1 IMPLANT
WAND WEREWOLF FASTSEAL 6.0 (MISCELLANEOUS) ×1 IMPLANT
WATER STERILE IRR 1000ML POUR (IV SOLUTION) ×1 IMPLANT

## 2023-04-06 NOTE — Op Note (Signed)
Patient Name: Nichole Hall  ZOX:[id number]  Pre-Operative Diagnosis: Right hip Osteoarthritis  Post-Operative Diagnosis: (same)  Procedure: Right  Total Hip Arthroplasty  Components/Implants: Cup: Trident Tritanium clusterhole 50/D w/x2 screws    Liner: Neutral X3 poly 36/D  Stem: Insignia #6 std offset  Head:biolox ceramic 36+28mm   Date of Surgery: 04/06/2023  Surgeon: Reinaldo Berber MD  Assistant: Amador Cunas PA (present and scrubbed throughout the case, critical for assistance with exposure, retraction, instrumentation, and closure)   Anesthesiologist: Joelene Millin  Anesthesia: Spinal   EBL: 300cc  IVF:400cc  Complications: None   Brief history: The patient is a 58 year old female with a history of osteoarthritis of the right hip with pain limiting their range of motion and activities of daily living, which has failed multiple attempts at conservative therapy.  The risks and benefits of total hip arthroplasty as definitive surgical treatment were discussed with the patient, who opted to proceed with the operation.  After outpatient medical clearance and optimization was completed the patient was admitted to [facility] for the procedure.  All preoperative films were reviewed and an appropriate surgical plan was made prior to surgery.   Description of procedure: The patient was brought to the operating room where laterality was confirmed by all those present to be the right  side.  The patient was administered spinal anesthesia on a stretcher prior to being moved supine on the operating room table. Patient was given an intravenous dose of antibiotics for surgical prophylaxis and TXA.  All bony prominences and extremities were well padded and the patient was securely attached to the table boots, a perineal post was placed and the patient had a safety strap placed.  Surgical site was prepped with alcohol and chlorhexidine. The surgical site over the hip was and draped  in typical sterile fashion with multiple layers of adhesive and nonadhesive drapes.  The incision site was marked out with a sterile marker and care was taken to assess the position of the ASIS and ensure appropriate position for the incision.    A surgical timeout was then called with participation of all staff in the room the patient was then a confirmed again and laterality confirmed.  Incision was made over the anterior lateral aspect of the proximal thigh in line with the TFL.  Appropriate retractors were placed and all bleeding vessels were coagulated within the subcutaneous and fatty layers.  An incision was made in the TFL fascia in the interval was carefully identified.  The lateral ascending branches of the circumflex vessels were identified, cauterized and carefully dissected. The main vessels were then tied with a 0 silk hand tie.  Retractors were placed around the superior lateral and inferior medial aspects of the femoral neck and a capsulotomy was performed exposing the hip joint.  Retraction stitches were placed and the capsulotomy to assist with visualization.  The femoral head was evaluated and shown to have full thickness cartilage loss with osteophytes formation along the neck. Femoral neck cut was then made and the femoral head was extracted after placing the leg in traction.  Bone wax was then applied to the proximal cut surface of the femur and aqua mantis was used to address any bleeding around the femoral neck cut.  Retractors were then placed around the acetabulum to fully visualize the joint space, and the remaining labral tissue was removed and pulvinar was removed.   The acetabulum was then sequentially reamed up to the appropriate size in order to get good  fit and fill for the acetabular component while under fluoroscopic guidance.  Acetabular component was then placed and malleted into a secure fit while confirming position and abduction angle and anteversion utilizing  fluoroscopy.  2 screws were then placed in the acetabular cup to assist in securing the cup in place. The cup was irrigated,  a real neutral liner was placed, impacted, and checked for stability. The femur traction was dropped and sequentially externally rotated while performing a release of the posterior and superomedial tissues off of the proximal femur to allow for mobility, care was taken to preserve the external rotators and piriformis attachments.  The remaining interval between the abductors and the capsule was dissected out and a retractor was placed over the superolateral aspect of the femur over the greater trochanter.  The leg was carefully brought down into extension and adducted to provide visualization of the proximal femur for broaching.  The femur was then sequentially broached up to an appropriate size which provided for good fill and stability to the femoral broach.  A trial neck and head were placed on the femoral broach and the leg was brought up for reduction.  The hip was reduced and manual check of stability was performed.  The hip was found to be stable in flexion internal rotation and extension external rotation.  Leg lengths were confirmed on fluoroscopy.   The hip was then dislocated the trial neck and head were removed.  The leg was then brought down into extension and adduction in the proximal femur was reexposed.  The broach trial was removed and the femur was irrigated with normal saline prior to the real femoral stem being implanted.  After the femoral stem was seated and shown to have good fit and fill the appropriate head was impacted the leg was brought up and reduced.  There was good range of motion with stability in flexion internal rotation and extension external rotation on testing.  Leg lengths were found to be appropriate on fluoroscopic evaluation at this time.  The hip was then irrigated with betdine based surgiphor solution and then saline solution.  The capsulotomy was  repaired with Ethibond sutures. Surgiflo was applied to the cut edges of the femoral neck to reduce any postoperative bleeding. A pericapsular and peritrochanteric cocktail with Exparel and bupivacaine was then injected as well as the subcutaneous tissues. The fascia was closed with a #1 barbed running suture.  The deep tissues were closed with Vicryl sutures the subcutaneous tissues were closed with interrupted Vicryl sutures and a running barbed 4-0 suture.  The skin was then reinforced with Dermabond and a sterile dressing was placed.   The patient was awoken from anesthesia transferred off of the operating room table onto a hospital bed where examination of leg lengths found the leg lengths to be equal with a good distal pulse.  The patient was then transferred to the PACU in stable condition.

## 2023-04-06 NOTE — Evaluation (Addendum)
Physical Therapy Evaluation Patient Details Name: Nichole Hall MRN: [id number] DOB: [birth year] Today's Date: 04/06/2023  History of Present Illness  Patient is a 58 year old female with osteoarthritis of right hip s/p right total hip arthroplasty. History of anxiety, insomnia, tobacco use.  Clinical Impression  Patient is agreeable to PT evaluation. Supportive significant other at bedside. Patient  is typically independent at baseline without assistive device.  Today she performed bed mobility with no assistance required. Standing performed x 2 bouts with CGA using rolling walker with cues for RLE positioning for comfort. Cues for sequencing of rolling walker and BLE with carry over demonstrated with ambulation to bathroom and in room. Recommend to continue PT to maximize independence. Patient hopeful for d/c home tomorrow morning after therapy follow up.       If plan is discharge home, recommend the following: Help with stairs or ramp for entrance;Assist for transportation   Can travel by private vehicle        Equipment Recommendations Rolling walker (2 wheels);BSC/3in1  Recommendations for Other Services       Functional Status Assessment Patient has had a recent decline in their functional status and demonstrates the ability to make significant improvements in function in a reasonable and predictable amount of time.     Precautions / Restrictions Precautions Precautions: Anterior Hip;Fall Precaution Booklet Issued: Yes (comment) Restrictions Weight Bearing Restrictions Per Provider Order: Yes RLE Weight Bearing Per Provider Order: Weight bearing as tolerated      Mobility  Bed Mobility Overal bed mobility: Needs Assistance Bed Mobility: Supine to Sit, Sit to Supine     Supine to sit: Supervision, HOB elevated Sit to supine: Supervision, HOB elevated        Transfers Overall transfer level: Needs assistance Equipment used: Rolling walker (2 wheels) Transfers: Sit  to/from Stand Sit to Stand: Contact guard assist           General transfer comment: cues for RLEpositioning with sitting    Ambulation/Gait Ambulation/Gait assistance: Contact guard assist, Supervision Gait Distance (Feet): 30 Feet Assistive device: Rolling walker (2 wheels) Gait Pattern/deviations: Step-to pattern, Step-through pattern, Decreased stance time - right Gait velocity: decreased     General Gait Details: cues for sequencing of BLE and rolling walker.  Stairs            Wheelchair Mobility     Tilt Bed    Modified Rankin (Stroke Patients Only)       Balance Overall balance assessment: Mild deficits observed, not formally tested                                           Pertinent Vitals/Pain Pain Assessment Pain Assessment: 0-10 Pain Score: 9  Pain Location: R hip Pain Descriptors / Indicators: Discomfort, Burning Pain Intervention(s): Limited activity within patient's tolerance, Monitored during session, Repositioned, Ice applied    Home Living Family/patient expects to be discharged to:: Private residence Living Arrangements: Other relatives Available Help at Discharge: Family Type of Home: House Home Access: Stairs to enter   Secretary/administrator of Steps: 1-2   Home Layout: One level Home Equipment: None      Prior Function Prior Level of Function : Independent/Modified Independent;Driving                     Extremity/Trunk Assessment   Upper Extremity Assessment Upper Extremity Assessment:  Overall WFL for tasks assessed    Lower Extremity Assessment Lower Extremity Assessment: RLE deficits/detail RLE Deficits / Details: patient able to activate hip/knee/ankle movement and stand without knee buckling RLE Sensation: WNL       Communication   Communication Communication: No apparent difficulties  Cognition Arousal: Alert Behavior During Therapy: WFL for tasks assessed/performed Overall  Cognitive Status: Within Functional Limits for tasks assessed                                          General Comments      Exercises     Assessment/Plan    PT Assessment Patient needs continued PT services  PT Problem List Decreased range of motion;Decreased strength;Decreased activity tolerance;Decreased balance;Decreased mobility;Pain;Decreased safety awareness;Decreased knowledge of use of DME       PT Treatment Interventions DME instruction;Stair training;Gait training;Functional mobility training;Therapeutic exercise;Therapeutic activities;Balance training;Neuromuscular re-education;Cognitive remediation;Patient/family education    PT Goals (Current goals can be found in the Care Plan section)  Acute Rehab PT Goals Patient Stated Goal: to go home tomorrow PT Goal Formulation: With patient Time For Goal Achievement: 04/20/23 Potential to Achieve Goals: Good    Frequency BID     Co-evaluation               AM-PAC PT "6 Clicks" Mobility  Outcome Measure Help needed turning from your back to your side while in a flat bed without using bedrails?: None Help needed moving from lying on your back to sitting on the side of a flat bed without using bedrails?: A Little Help needed moving to and from a bed to a chair (including a wheelchair)?: A Little Help needed standing up from a chair using your arms (e.g., wheelchair or bedside chair)?: A Little Help needed to walk in hospital room?: A Little Help needed climbing 3-5 steps with a railing? : A Little 6 Click Score: 19    End of Session Equipment Utilized During Treatment: Gait belt Activity Tolerance: Patient tolerated treatment well Patient left: in bed;with call bell/phone within reach;with family/visitor present;with SCD's reapplied (ice applied R hip) Nurse Communication: Mobility status PT Visit Diagnosis: Other abnormalities of gait and mobility (R26.89);Difficulty in walking, not elsewhere  classified (R26.2)    Time: 2725-3664 PT Time Calculation (min) (ACUTE ONLY): 26 min   Charges:   PT Evaluation $PT Eval Low Complexity: 1 Low PT Treatments $Gait Training: 8-22 mins PT General Charges $$ ACUTE PT VISIT: 1 Visit         Donna Bernard, PT, MPT   Ina Homes 04/06/2023, 4:01 PM
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
## 2023-04-06 NOTE — Interval H&P Note (Signed)
Patient history and physical updated. Consent reviewed including risks, benefits, and alternatives to surgery. Patient agrees with above plan to proceed with right anterior total hip arthroplasty.

## 2023-04-06 NOTE — Transfer of Care (Signed)
Immediate Anesthesia Transfer of Care Note  Patient: Nichole Hall  Procedure(s) Performed: TOTAL HIP ARTHROPLASTY ANTERIOR APPROACH (Right: Hip)  Patient Location: PACU  Anesthesia Type:General  Level of Consciousness: awake, alert , and drowsy  Airway & Oxygen Therapy: Patient Spontanous Breathing and Patient connected to face mask  Post-op Assessment: Report given to RN and Post -op Vital signs reviewed and stable  Post vital signs: Reviewed and stable  Last Vitals:  Vitals Value Taken Time  BP 94/58 04/06/23 0945  Temp 35.9   Pulse 85 04/06/23 0948  Resp 19 04/06/23 0948  SpO2 100 % 04/06/23 0948  Vitals shown include unfiled device data.  Last Pain:  Vitals:   04/06/23 0618  TempSrc: Temporal  PainSc: 8          Complications: No notable events documented.

## 2023-04-06 NOTE — Anesthesia Procedure Notes (Addendum)
Spinal  Patient location during procedure: OR Start time: 04/06/2023 7:28 AM End time: 04/06/2023 7:38 AM Reason for block: surgical anesthesia Staffing Performed: resident/CRNA  Anesthesiologist: Louie Boston, MD Resident/CRNA: Morene Crocker, CRNA Performed by: Morene Crocker, CRNA Authorized by: Louie Boston, MD   Preanesthetic Checklist Completed: patient identified, IV checked, site marked, risks and benefits discussed, surgical consent, monitors and equipment checked, pre-op evaluation and timeout performed Spinal Block Patient position: sitting Prep: ChloraPrep Patient monitoring: heart rate, continuous pulse ox and blood pressure Approach: midline Location: L3-4 Injection technique: single-shot Needle Needle type: Pencan  Needle gauge: 24 G Needle length: 9 cm Assessment Sensory level: T10 Events: CSF return Additional Notes Negative paresthesia. Negative blood return. Positive free-flowing CSF. Expiration date of kit checked and confirmed. Patient tolerated procedure well, without complications. Successful on first attempt, no complications noted. Pt. tolerated procedure very well, no c/o pain/discomfort.
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
## 2023-04-06 NOTE — TOC CM/SW Note (Signed)
Patient is not able to walk the distance required to go the bathroom, or he/she is unable to safely negotiate stairs required to access the bathroom.  A 3in1 BSC will alleviate this problem

## 2023-04-06 NOTE — Discharge Instructions (Signed)
Instructions after Anterior Total Hip Replacement        Dr. Regenia Skeeter., M.D.      Dept. of Orthopaedics & Sports Medicine  [facility]  [address]  [place], [place]  [address]  Phone: [phone]   Fax: [phone]    DIET: Drink plenty of non-alcoholic fluids. Resume your normal diet. Include foods high in fiber.  ACTIVITY:  You may use crutches or a walker with weight-bearing as tolerated, unless instructed otherwise. You may be weaned off of the walker or crutches by your Physical Therapist.  Continue doing gentle exercises. Exercising will reduce the pain and swelling, increase motion, and prevent muscle weakness.   Please continue to use the TED compression stockings for 2 weeks. You may remove the stockings at night, but should reapply them in the morning. Do not drive or operate any equipment until instructed.  WOUND CARE:  Continue to use ice packs periodically to reduce pain and swelling. You may shower with honeycomb dressing 3 days after your surgery. Do not submerge incision site under water. Remove honeycomb dressing 7 days after surgery and allow dermabond to fall off on its own.   MEDICATIONS: You may resume your regular medications. Please take the pain medication as prescribed on the medication list. Do not take pain medication on an empty stomach. You have been given a prescription for a blood thinner to prevent blood clots. Please take the medication as instructed. (NOTE: After completing a 2 week course of Lovenox, take one Enteric-coated 81 mg aspirin twice a day for 3 additional weeks.) Pain medications and iron supplements can cause constipation. Use a stool softener (Senokot or Colace) on a daily basis and a laxative (dulcolax or miralax) as needed. Do not drive or drink alcoholic beverages when taking pain medications.  POSTOPERATIVE CONSTIPATION PROTOCOL Constipation - defined medically as fewer than three stools per week and  severe constipation as less than one stool per week.  One of the most common issues patients have following surgery is constipation.  Even if you have a regular bowel pattern at home, your normal regimen is likely to be disrupted due to multiple reasons following surgery.  Combination of anesthesia, postoperative narcotics, change in appetite and fluid intake all can affect your bowels.  In order to avoid complications following surgery, here are some recommendations in order to help you during your recovery period.  Colace (docusate) - Pick up an over-the-counter form of Colace or another stool softener and take twice a day as long as you are requiring postoperative pain medications.  Take with a full glass of water daily.  If you experience loose stools or diarrhea, hold the colace until you stool forms back up.  If your symptoms do not get better within 1 week or if they get worse, check with your doctor.  Dulcolax (bisacodyl) - Pick up over-the-counter and take as directed by the product packaging as needed to assist with the movement of your bowels.  Take with a full glass of water.  Use this product as needed if not relieved by Colace only.   MiraLax (polyethylene glycol) - Pick up over-the-counter to have on hand.  MiraLax is a solution that will increase the amount of water in your bowels to assist with bowel movements.  Take as directed and can mix with a glass of water, juice, soda, coffee, or tea.  Take if you go more than two days without a movement. Do not use MiraLax more than  once per day. Call your doctor if you are still constipated or irregular after using this medication for 7 days in a row.  If you continue to have problems with postoperative constipation, please contact the office for further assistance and recommendations.  If you experience "the worst abdominal pain ever" or develop nausea or vomiting, please contact the office immediatly for further recommendations for  treatment.   CALL THE OFFICE FOR: Temperature above 101 degrees Excessive bleeding or drainage on the dressing. Excessive swelling, coldness, or paleness of the toes. Persistent nausea and vomiting.  FOLLOW-UP:  You should have an appointment to return to the office in 2 weeks after surgery. Arrangements have been made for continuation of Physical Therapy (either home therapy or outpatient therapy).

## 2023-04-07 DIAGNOSIS — M1611 Unilateral primary osteoarthritis, right hip: Secondary | ICD-10-CM | POA: Diagnosis not present

## 2023-04-07 LAB — CBC
HCT: 30.2 % — ABNORMAL LOW (ref 36.0–46.0)
Hemoglobin: 9.8 g/dL — ABNORMAL LOW (ref 12.0–15.0)
MCH: 30.3 pg (ref 26.0–34.0)
MCHC: 32.5 g/dL (ref 30.0–36.0)
MCV: 93.5 fL (ref 80.0–100.0)
Platelets: 191 10*3/uL (ref 150–400)
RBC: 3.23 MIL/uL — ABNORMAL LOW (ref 3.87–5.11)
RDW: 13 % (ref 11.5–15.5)
WBC: 12.4 10*3/uL — ABNORMAL HIGH (ref 4.0–10.5)
nRBC: 0 % (ref 0.0–0.2)

## 2023-04-07 LAB — BASIC METABOLIC PANEL
Anion gap: 6 (ref 5–15)
BUN: 15 mg/dL (ref 6–20)
CO2: 26 mmol/L (ref 22–32)
Calcium: 8.7 mg/dL — ABNORMAL LOW (ref 8.9–10.3)
Chloride: 105 mmol/L (ref 98–111)
Creatinine, Ser: 0.92 mg/dL (ref 0.44–1.00)
GFR, Estimated: >60 mL/min (ref >60)
Glucose, Bld: 148 mg/dL — ABNORMAL HIGH (ref 70–99)
Potassium: 4 mmol/L (ref 3.5–5.1)
Sodium: 137 mmol/L (ref 135–145)

## 2023-04-07 MED ORDER — CELECOXIB 200 MG PO CAPS: 200.0000 mg | ORAL_CAPSULE | Freq: Two times a day (BID) | ORAL | 0 refills | Status: AC

## 2023-04-07 MED ORDER — FE FUM-VIT C-VIT B12-FA 460-60-0.01-1 MG PO CAPS: 1.0000 | ORAL_CAPSULE | Freq: Every day | ORAL | 0 refills | Status: AC

## 2023-04-07 MED ORDER — TRAMADOL HCL 50 MG PO TABS
ORAL_TABLET | ORAL | Status: AC
  Filled 2023-04-07: qty 1

## 2023-04-07 MED ORDER — FE FUM-VIT C-VIT B12-FA 460-60-0.01-1 MG PO CAPS
1.0000 | ORAL_CAPSULE | Freq: Every day | ORAL | Status: DC
  Administered 2023-04-07: 1 via ORAL
  Filled 2023-04-07: qty 1

## 2023-04-07 MED ORDER — ENOXAPARIN SODIUM 40 MG/0.4ML IJ SOSY: 40.0000 mg | PREFILLED_SYRINGE | INTRAMUSCULAR | 0 refills | Status: AC

## 2023-04-07 MED ORDER — PANTOPRAZOLE SODIUM 40 MG PO TBEC
DELAYED_RELEASE_TABLET | ORAL | Status: AC
  Filled 2023-04-07: qty 1

## 2023-04-07 MED ORDER — SODIUM CHLORIDE 0.9 % IV BOLUS
500.0000 mL | Freq: Once | INTRAVENOUS | Status: AC
  Administered 2023-04-07: 500 mL via INTRAVENOUS

## 2023-04-07 MED ORDER — OXYCODONE HCL 5 MG PO TABS: 2.5000 mg | ORAL_TABLET | Freq: Three times a day (TID) | ORAL | 0 refills | Status: AC | PRN

## 2023-04-07 MED ORDER — FAMOTIDINE IN NACL 20-0.9 MG/50ML-% IV SOLN
20.0000 mg | Freq: Once | INTRAVENOUS | Status: AC
  Administered 2023-04-07: 20 mg via INTRAVENOUS
  Filled 2023-04-07: qty 50

## 2023-04-07 MED ORDER — KETOROLAC TROMETHAMINE 15 MG/ML IJ SOLN
INTRAMUSCULAR | Status: AC
  Filled 2023-04-07: qty 1

## 2023-04-07 MED ORDER — DOCUSATE SODIUM 100 MG PO CAPS: 100.0000 mg | ORAL_CAPSULE | Freq: Two times a day (BID) | ORAL | 0 refills | Status: AC

## 2023-04-07 MED ORDER — CALCIUM CARBONATE ANTACID 500 MG PO CHEW
CHEWABLE_TABLET | ORAL | Status: AC
  Filled 2023-04-07: qty 2

## 2023-04-07 MED ORDER — ONDANSETRON HCL 4 MG PO TABS: 4.0000 mg | ORAL_TABLET | Freq: Four times a day (QID) | ORAL | 0 refills | Status: AC | PRN

## 2023-04-07 MED ORDER — CALCIUM CARBONATE ANTACID 500 MG PO CHEW
2.0000 | CHEWABLE_TABLET | Freq: Four times a day (QID) | ORAL | Status: DC | PRN
  Administered 2023-04-07: 400 mg via ORAL
  Filled 2023-04-07: qty 2

## 2023-04-07 MED ORDER — METOCLOPRAMIDE HCL 10 MG PO TABS
ORAL_TABLET | ORAL | Status: AC
  Filled 2023-04-07: qty 1

## 2023-04-07 MED ORDER — TRAMADOL HCL 50 MG PO TABS: 50.0000 mg | ORAL_TABLET | Freq: Four times a day (QID) | ORAL | 0 refills | Status: AC | PRN

## 2023-04-07 MED ORDER — ACETAMINOPHEN 500 MG PO TABS: 1000.0000 mg | ORAL_TABLET | Freq: Three times a day (TID) | ORAL | 0 refills | Status: AC

## 2023-04-07 MED ORDER — ACETAMINOPHEN 500 MG PO TABS
ORAL_TABLET | ORAL | Status: AC
  Filled 2023-04-07: qty 1

## 2023-04-07 MED ORDER — ENOXAPARIN SODIUM 40 MG/0.4ML IJ SOSY
PREFILLED_SYRINGE | INTRAMUSCULAR | Status: AC
  Filled 2023-04-07: qty 0.4

## 2023-04-07 MED ORDER — DOCUSATE SODIUM 100 MG PO CAPS
ORAL_CAPSULE | ORAL | Status: AC
  Filled 2023-04-07: qty 1

## 2023-04-07 MED ORDER — HYDROCODONE-ACETAMINOPHEN 5-325 MG PO TABS
ORAL_TABLET | ORAL | Status: AC
  Filled 2023-04-07: qty 2

## 2023-04-07 NOTE — Plan of Care (Signed)
Problem: Activity: Goal: Ability to avoid complications of mobility impairment will improve Outcome: Progressing   Problem: Pain Management: Goal: Pain level will decrease with appropriate interventions Outcome: Progressing

## 2023-04-07 NOTE — Progress Notes (Signed)
DISCHARGE NOTE:  Pt given discharge instructions and scripts. Pt verbalized understanding. TED hose on both legs. BSC and walker sent with pt. Pt wheeled to car by staff, family providing transportation.

## 2023-04-07 NOTE — Anesthesia Postprocedure Evaluation (Signed)
Anesthesia Post Note  Patient: Constance Goltz  Procedure(s) Performed: TOTAL HIP ARTHROPLASTY ANTERIOR APPROACH (Right: Hip)  Patient location during evaluation: Nursing Unit Anesthesia Type: Spinal Level of consciousness: oriented and awake and alert Pain management: pain level controlled Vital Signs Assessment: post-procedure vital signs reviewed and stable Respiratory status: spontaneous breathing and respiratory function stable Cardiovascular status: blood pressure returned to baseline and stable Postop Assessment: no headache, no backache, no apparent nausea or vomiting and patient able to bend at knees Anesthetic complications: no   No notable events documented.   Last Vitals:  Vitals:   04/06/23 2311 04/07/23 0332  BP: 112/63 (!) 102/54  Pulse: 79 65  Resp: 16 16  Temp: (!) 36.2 C 36.4 C  SpO2: 95% 97%    Last Pain:  Vitals:   04/07/23 0555  TempSrc:   PainSc: 6                  Jeraldin Fesler [address]

## 2023-04-07 NOTE — Discharge Summary (Signed)
Physician Discharge Summary  Patient ID: Nichole Hall MRN: [id number] DOB/AGE: 1965-08-27 58 y.o.  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 female who was admitted 04/06/2023 with a diagnosis of S/P total right hip arthroplasty and went to the operating room on 04/06/2023 and underwent the above named procedures.    Surgeries: Procedure(s): TOTAL HIP ARTHROPLASTY ANTERIOR APPROACH on 04/06/2023 Patient tolerated the surgery well. Taken to PACU where she was stabilized and then transferred to the orthopedic floor.  Started on Lovenox 40 mg q 24 hrs. TEDs and SCDs applied bilaterally. Heels elevated on bed. No evidence of DVT. Negative Homan. Physical therapy started on day #1 for gait training and transfer. OT started day #1 for ADL and assisted devices.  Patient's IV was d/c on day #1. Patient was able to safely and independently complete all PT goals. PT recommending discharge to home.    On post op day #1 patient was stable and ready for discharge to home with HHPT.  Implants: Cup: Trident  Tritanium clusterhole 50/D w/x2 screws    Liner: Neutral X3 poly 36/D  Stem: Insignia #6 std offset  Head:biolox ceramic 36+23mm   She was given perioperative antibiotics:  Anti-infectives (From admission, onward)    Start     Dose/Rate Route Frequency Ordered Stop   04/06/23 1330  ceFAZolin (ANCEF) IVPB 2g/100 mL premix        2 g 200 mL/hr over 30 Minutes Intravenous Every 6 hours 04/06/23 1152 04/06/23 1931   04/06/23 0600  ceFAZolin (ANCEF) IVPB 2g/100 mL premix        2 g 200 mL/hr over 30 Minutes Intravenous On call to O.R. 04/05/23 2245 04/06/23 0757     .  She was given sequential compression devices, early ambulation, and Lovenox TEDs for DVT prophylaxis.  She benefited maximally from the hospital stay and there were no complications.    Recent vital signs:  Vitals:   04/06/23 2311 04/07/23 0332  BP: 112/63 (!) 102/54  Pulse: 79 65  Resp: 16 16  Temp: (!) 97.2 F (36.2 C) 97.6 F (36.4 C)  SpO2: 95% 97%    Recent laboratory studies:  Lab Results  Component Value Date   HGB 9.8 (L) 04/07/2023   HGB 13.9 03/31/2023   HGB 13.6 08/08/2021   Lab Results  Component Value Date   WBC 12.4 (H) 04/07/2023   PLT 191 04/07/2023   Lab Results  Component Value Date   INR 0.93  01/25/2018   Lab Results  Component Value Date   NA 137 04/07/2023   K 4.0 04/07/2023   CL 105 04/07/2023   CO2 26 04/07/2023   BUN 15 04/07/2023   CREATININE 0.92 04/07/2023   GLUCOSE 148 (H) 04/07/2023    Discharge Medications:   Allergies as of 04/07/2023       Reactions   Amoxicillin Anaphylaxis   Morphine Other (See Comments)   Dramatic drop in BP   Penicillins Rash   Has patient had a PCN reaction causing immediate rash, facial/tongue/throat swelling, SOB or lightheadedness with hypotension: Yes Has patient had a PCN reaction causing severe rash involving mucus membranes or skin necrosis: No Has patient had a PCN reaction that required hospitalization: No Has patient had a PCN  reaction occurring within the last 10 years: No If all of the above answers are "NO", then may proceed with Cephalosporin use.        Medication List     TAKE these medications    acetaminophen 500 MG tablet Commonly known as: TYLENOL Take 2 tablets (1,000 mg total) by mouth every 8 (eight) hours.   celecoxib 200 MG capsule Commonly known as: CeleBREX Take 1 capsule (200 mg total) by mouth 2 (two) times daily for 10 days.   docusate sodium 100 MG capsule Commonly known as: COLACE Take 1 capsule (100 mg total) by mouth 2 (two) times daily.   enoxaparin 40 MG/0.4ML injection Commonly known as: LOVENOX Inject 0.4 mLs (40 mg total) into the skin daily for 14 days.   Fe Fum-Vit C-Vit B12-FA Caps capsule Commonly known as: TRIGELS-F FORTE Take 1 capsule by mouth daily after breakfast.   ondansetron 4 MG tablet Commonly known as: ZOFRAN Take 1 tablet (4 mg total) by mouth every 6 (six) hours as needed for nausea.   oxyCODONE 5 MG immediate release tablet Commonly known as: Roxicodone Take 0.5-1 tablets (2.5-5 mg total) by mouth every 8 (eight) hours as needed for breakthrough pain.   traMADol 50 MG tablet Commonly known as: ULTRAM Take 1 tablet (50 mg total) by mouth every 6 (six) hours as needed for moderate pain (pain score 4-6).               Durable Medical Equipment  (From admission, onward)           Start     Ordered   04/07/23 0720  For home use only DME 3 n 1  Once        04/07/23 0720   04/07/23 0720  For home use only DME Walker  Once       Question:  Patient needs a walker to treat with the following condition  Answer:  Total knee replacement status   04/07/23 0720            Diagnostic Studies: DG HIP UNILAT WITH PELVIS 2-3 VIEWS RIGHT Result Date: 04/06/2023 CLINICAL DATA:  Elective surgery. EXAM: DG HIP (WITH OR WITHOUT PELVIS) 2-3V RIGHT COMPARISON:  None Available. FINDINGS: Three fluoroscopic spot views of the pelvis and right hip  obtained in the operating room. Images during hip arthroplasty. Fluoroscopy time 29 seconds. Dose 4.22 mGy. IMPRESSION: Intraoperative fluoroscopy during right hip arthroplasty. Electronically Signed   By: Narda Rutherford M.D.   On: 04/06/2023 10:32   DG C-Arm 1-60 Min-No Report Result Date: 04/06/2023 Fluoroscopy was utilized by the requesting physician.  No radiographic interpretation.   DG C-Arm 1-60 Min-No Report Result Date: 04/06/2023 Fluoroscopy was utilized  by the requesting physician.  No radiographic interpretation.   MM 3D SCREENING MAMMOGRAM BILATERAL BREAST Result Date: 03/17/2023 CLINICAL DATA:  Screening. EXAM: DIGITAL SCREENING BILATERAL MAMMOGRAM WITH TOMOSYNTHESIS AND CAD TECHNIQUE: Bilateral screening digital craniocaudal and mediolateral oblique mammograms were obtained. Bilateral screening digital breast tomosynthesis was performed. The images were evaluated with computer-aided detection. COMPARISON:  Previous exam(s). ACR Breast Density Category b: There are scattered areas of fibroglandular density. FINDINGS: There are no findings suspicious for malignancy. IMPRESSION: No mammographic evidence of malignancy. A result letter of this screening mammogram will be mailed directly to the patient. RECOMMENDATION: Screening mammogram in one year. (Code:[id number]-01Y) BI-RADS CATEGORY  1: Negative. Electronically Signed   By: Amie Portland M.D.   On: 03/17/2023 08:35   MM Outside Films Mammo Result Date: 03/16/2023 This examination belongs to an outside facility and is stored here for comparison purposes only.  Contact the originating outside institution for any associated report or interpretation.  MM Outside Films Mammo Result Date: 03/16/2023 This examination belongs to an outside facility and is stored here for comparison purposes only.  Contact the originating outside institution for any associated report or interpretation.  MM Outside Films Mammo Result Date: 03/16/2023 This examination  belongs to an outside facility and is stored here for comparison purposes only.  Contact the originating outside institution for any associated report or interpretation.  MM Outside Films Mammo Result Date: 03/16/2023 This examination belongs to an outside facility and is stored here for comparison purposes only.  Contact the originating outside institution for any associated report or interpretation.  MM Outside Films Mammo Result Date: 03/16/2023 This examination belongs to an outside facility and is stored here for comparison purposes only.  Contact the originating outside institution for any associated report or interpretation.   Disposition:      Follow-up Information     Evon Slack, PA-C Follow up in 2 week(s).   Specialties: Orthopedic Surgery, Emergency Medicine Contact information: [address] [place] [address] [phone]                  Signed: Patience Musca 04/07/2023, 7:46 AM

## 2023-04-07 NOTE — Progress Notes (Signed)
Physical Therapy Treatment Patient Details Name: Nichole Hall MRN: [id number] DOB: Mar 31, 1965 Today's Date: 04/07/2023   History of Present Illness Patient is a 58 year old female with osteoarthritis of right hip s/p right total hip arthroplasty. History of anxiety, insomnia, tobacco use.    PT Comments  Patient is progressing well with increased activity tolerance and gait distance this session. Stair training completed. Mobility is adequate for discharge home with family support.    If plan is discharge home, recommend the following: Help with stairs or ramp for entrance;Assist for transportation   Can travel by private vehicle        Equipment Recommendations  Rolling walker (2 wheels);BSC/3in1    Recommendations for Other Services       Precautions / Restrictions Precautions Precautions: Anterior Hip;Fall Restrictions Weight Bearing Restrictions Per Provider Order: Yes RLE Weight Bearing Per Provider Order: Weight bearing as tolerated     Mobility  Bed Mobility Overal bed mobility: Needs Assistance Bed Mobility: Supine to Sit, Sit to Supine     Supine to sit: Supervision, HOB elevated          Transfers Overall transfer level: Needs assistance Equipment used: Rolling walker (2 wheels) Transfers: Sit to/from Stand Sit to Stand: Supervision                Ambulation/Gait Ambulation/Gait assistance: Supervision Gait Distance (Feet): 220 Feet Assistive device: Rolling walker (2 wheels) Gait Pattern/deviations: Step-to pattern, Step-through pattern, Decreased stance time - right Gait velocity: decreased     General Gait Details: reinforced safe techniqe, using rolling walker for support   Stairs Stairs: Yes Stairs assistance: Contact guard assist Stair Management: Two rails, Alternating pattern, Forwards Number of Stairs: 4 General stair comments: patient went up/down 4 steps after initial instruction on sequencing without  difficulty   Wheelchair Mobility     Tilt Bed    Modified Rankin (Stroke Patients Only)       Balance Overall balance assessment: Mild deficits observed, not formally tested                                          Cognition Arousal: Alert Behavior During Therapy: WFL for tasks assessed/performed Overall Cognitive Status: Within Functional Limits for tasks assessed                                          Exercises      General Comments General comments (skin integrity, edema, etc.): no dizziness reported with upright activity      Pertinent Vitals/Pain Pain Assessment Pain Assessment: Faces Faces Pain Scale: Hurts even more Pain Location: R hip Pain Descriptors / Indicators: Discomfort, Burning Pain Intervention(s): Limited activity within patient's tolerance, Monitored during session, Repositioned    Home Living                          Prior Function            PT Goals (current goals can now be found in the care plan section) Acute Rehab PT Goals Patient Stated Goal: home today PT Goal Formulation: With patient Time For Goal Achievement: 04/20/23 Potential to Achieve Goals: Good Progress towards PT goals: Progressing toward goals    Frequency    BID  PT Plan      Co-evaluation              AM-PAC PT "6 Clicks" Mobility   Outcome Measure  Help needed turning from your back to your side while in a flat bed without using bedrails?: None Help needed moving from lying on your back to sitting on the side of a flat bed without using bedrails?: A Little Help needed moving to and from a bed to a chair (including a wheelchair)?: A Little Help needed standing up from a chair using your arms (e.g., wheelchair or bedside chair)?: A Little Help needed to walk in hospital room?: A Little Help needed climbing 3-5 steps with a railing? : A Little 6 Click Score: 19    End of Session Equipment  Utilized During Treatment: Gait belt Activity Tolerance: Patient tolerated treatment well Patient left: in bed;with call bell/phone within reach;with family/visitor present (seated on side of bed per her preference) Nurse Communication: Mobility status PT Visit Diagnosis: Other abnormalities of gait and mobility (R26.89);Difficulty in walking, not elsewhere classified (R26.2)     Time: 2956-2130 PT Time Calculation (min) (ACUTE ONLY): 11 min  Charges:    $Gait Training: 8-22 mins PT General Charges $$ ACUTE PT VISIT: 1 Visit                     Donna Bernard, PT, MPT    Ina Homes 04/07/2023, 11:48 AM

## 2023-04-07 NOTE — Progress Notes (Signed)
   Subjective: 1 Day Post-Op Procedure(s) (LRB): TOTAL HIP ARTHROPLASTY ANTERIOR APPROACH (Right) Patient reports pain as mild.   Overall patient doing well.  Pain well-controlled.  Ambulatory to the bathroom with no complications.  Is complaining of a lot of heartburn, burning in the esophageal region, she contributes this to the Norco, states she is experienced this in the past with Norco. Denies any CP, SOB, ABD pain. We will continue therapy today.  Plan is to go Home after hospital stay.  Objective: Vital signs in last 24 hours: Temp:  [96.8 F (36 C)-97.6 F (36.4 C)] 97.6 F (36.4 C) (01/28 0332) Pulse Rate:  [56-86] 65 (01/28 0332) Resp:  [10-20] 16 (01/28 0332) BP: (67-112)/(43-75) 102/54 (01/28 0332) SpO2:  [95 %-100 %] 97 % (01/28 0332)  Intake/Output from previous day: 01/27 0701 - 01/28 0700 In: 2671.4 [P.O.:474; I.V.:1035.4; IV Piggyback:1162] Out: 2500 [Urine:2200; Blood:300] Intake/Output this shift: No intake/output data recorded.  Recent Labs    04/07/23 0604  HGB 9.8*   Recent Labs    04/07/23 0604  WBC 12.4*  RBC 3.23*  HCT 30.2*  PLT 191   Recent Labs    04/07/23 0604  NA 137  K 4.0  CL 105  CO2 26  BUN 15  CREATININE 0.92  GLUCOSE 148*  CALCIUM 8.7*   No results for input(s): "LABPT", "INR" in the last 72 hours.  EXAM General - Patient is Alert, Appropriate, and Oriented Extremity - Neurovascular intact Sensation intact distally Intact pulses distally Dorsiflexion/Plantar flexion intact No cellulitis present Compartment soft Dressing - dressing C/D/I and no drainage Motor Function - intact, moving foot and toes well on exam.   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   Weight as of this encounter: 88 kg. Advance diet Up with therapy Pain well-controlled  Vital signs are stable, BP soft.  Will give 500 cc bolus of fluids  Acute postop blood loss anemia, hemoglobin 9.8.  Will place on iron supplement daily  Gastroesophageal reflux -Tums, Pepcid  Care management to assist with discharge to home with home health PT today.  DVT Prophylaxis - Lovenox, TED hose, and SCD Weight-Bearing as tolerated to right leg   T. Cranston Neighbor, PA-C [facility] 04/07/2023, 7:29 AM

## 2023-04-07 NOTE — TOC Initial Note (Signed)
Transition of Care [facility]) - Initial/Assessment Note    Patient Details  Name: Nichole Hall MRN: [id number] Date of Birth: 02/27/1966  Transition of Care [facility]) CM/SW Contact:    Marlowe Sax, RN Phone Number: 04/07/2023, 8:47 AM  Clinical Narrative:                  [facility] set up for [facility] by surgeons office prior to surgery RW and 3 in1 to be delivered by adapt       Patient Goals and CMS Choice            Expected Discharge Plan and Services         Expected Discharge Date: 04/07/23                                    Prior Living Arrangements/Services                       Activities of Daily Living   ADL Screening (condition at time of admission) Independently performs ADLs?: Yes (appropriate for developmental age) Is the patient deaf or have difficulty hearing?: No Does the patient have difficulty seeing, even when wearing glasses/contacts?: No Does the patient have difficulty concentrating, remembering, or making decisions?: No  Permission Sought/Granted                  Emotional Assessment              Admission diagnosis:  S/P total right hip arthroplasty [Z96.641] Patient Active Problem List   Diagnosis Date Noted   S/P total right hip arthroplasty 04/06/2023   Chest pain 01/25/2018   Arthritis 05/27/2017   History of lumpectomy of left breast 05/27/2017   Colonic constipation    Polyp of sigmoid colon    Rectal polyp    Benign breast neoplasm 11/20/2014   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:   [facility] [address]), [place] - [address] Jerilynn Mages [place]) [place] [address] Phone: [phone] Fax: [phone]     Social Drivers of Health (SDOH) Social History: SDOH Screenings   Food Insecurity: No Food Insecurity (04/06/2023)  Housing: Low Risk  (04/06/2023)  Transportation Needs: No  Transportation Needs (04/06/2023)  Utilities: Not At Risk (04/06/2023)  Social Connections: Moderately Isolated (04/06/2023)  Tobacco Use: Medium Risk (04/06/2023)   SDOH Interventions:     Readmission Risk Interventions     No data to display

## 2023-04-09 ENCOUNTER — Encounter: Payer: Self-pay | Admitting: Orthopedic Surgery

## 2023-05-12 ENCOUNTER — Other Ambulatory Visit: Payer: Self-pay

## 2023-05-12 ENCOUNTER — Emergency Department

## 2023-05-12 ENCOUNTER — Emergency Department
Admission: EM | Admit: 2023-05-12 | Discharge: 2023-05-13 | Disposition: A | Discharge status: Home / Self Care | Attending: Emergency Medicine | Admitting: Emergency Medicine

## 2023-05-12 DIAGNOSIS — R051 Acute cough: Secondary | ICD-10-CM | POA: Diagnosis not present

## 2023-05-12 DIAGNOSIS — R0602 Shortness of breath: Secondary | ICD-10-CM | POA: Diagnosis not present

## 2023-05-12 DIAGNOSIS — Z720 Tobacco use: Secondary | ICD-10-CM | POA: Diagnosis not present

## 2023-05-12 DIAGNOSIS — R079 Chest pain, unspecified: Secondary | ICD-10-CM | POA: Insufficient documentation

## 2023-05-12 LAB — BASIC METABOLIC PANEL
Anion gap: 8 (ref 5–15)
BUN: 12 mg/dL (ref 6–20)
CO2: 23 mmol/L (ref 22–32)
Calcium: 9.5 mg/dL (ref 8.9–10.3)
Chloride: 107 mmol/L (ref 98–111)
Creatinine, Ser: 0.95 mg/dL (ref 0.44–1.00)
GFR, Estimated: >60 mL/min (ref >60)
Glucose, Bld: 160 mg/dL — ABNORMAL HIGH (ref 70–99)
Potassium: 3.5 mmol/L (ref 3.5–5.1)
Sodium: 138 mmol/L (ref 135–145)

## 2023-05-12 LAB — TROPONIN I (HIGH SENSITIVITY)
Troponin I (High Sensitivity): 2 ng/L (ref <18)
Troponin I (High Sensitivity): <2 ng/L (ref <18)

## 2023-05-12 LAB — CBC
HCT: 39.1 % (ref 36.0–46.0)
Hemoglobin: 13.2 g/dL (ref 12.0–15.0)
MCH: 30.3 pg (ref 26.0–34.0)
MCHC: 33.8 g/dL (ref 30.0–36.0)
MCV: 89.9 fL (ref 80.0–100.0)
Platelets: 266 10*3/uL (ref 150–400)
RBC: 4.35 MIL/uL (ref 3.87–5.11)
RDW: 13 % (ref 11.5–15.5)
WBC: 10.9 10*3/uL — ABNORMAL HIGH (ref 4.0–10.5)
nRBC: 0 % (ref 0.0–0.2)

## 2023-05-12 LAB — RESP PANEL BY RT-PCR (RSV, FLU A&B, COVID)  RVPGX2
Influenza A by PCR: NEGATIVE
Influenza B by PCR: NEGATIVE
Resp Syncytial Virus by PCR: NEGATIVE
SARS Coronavirus 2 by RT PCR: NEGATIVE

## 2023-05-12 LAB — BRAIN NATRIURETIC PEPTIDE: B Natriuretic Peptide: 30.5 pg/mL (ref 0.0–100.0)

## 2023-05-12 LAB — D-DIMER, QUANTITATIVE: D-Dimer, Quant: 0.89 ug{FEU}/mL — ABNORMAL HIGH (ref 0.00–0.50)

## 2023-05-12 MED ORDER — IOHEXOL 350 MG/ML SOLN
75.0000 mL | Freq: Once | INTRAVENOUS | Status: AC | PRN
  Administered 2023-05-12: 75 mL via INTRAVENOUS

## 2023-05-12 NOTE — ED Triage Notes (Signed)
 EMS brings pt in from home for left shoulder/neck/arm pain accomp by [facility]; no hx of same; rt THR approx 5wks PTA

## 2023-05-12 NOTE — ED Provider Notes (Signed)
-----------------------------------------   11:35 PM on 05/12/2023 -----------------------------------------  Assuming care from Dr. Jodie Echevaria.  In short, Nichole Hall is a 58 y.o. female with a chief complaint of chest pain after ortho surgery.  Refer to the original H&P for additional details.  The current plan of care is to follow up CTA chest.  Discharge if stable and appropriate.   Clinical Course as of 05/13/23 0215  Tue May 12, 2023  2259 Independent review of labs, electrolytes not severely deranged, creatinine is normal, mild leukocytosis, D-dimer is elevated, initial troponin is negative. [TT]  Wed May 13, 2023  0213 CT Angio Chest PE W/Cm &/Or Wo Cm I viewed and interpreted the patient's CTA chest and I see no evidence of pulmonary embolism and radiology confirmed no new or acute findings.  I reassessed the patient and went over the results, explained I do not have a specific explanation for the symptoms but overall her evaluation is very reassuring.  She is comfortable with the plan for discharge and outpatient follow-up.  I gave my usual and customary return precautions. [CF]    Clinical Course User Index [CF] Loleta Rose, MD [TT] Claybon Jabs, MD     Medications  iohexol (OMNIPAQUE) 350 MG/ML injection 75 mL (75 mLs Intravenous Contrast Given 05/12/23 2305)     ED Discharge Orders          Ordered    Ambulatory referral to Cardiology       Comments: If you have not heard from the Cardiology office within the next 72 hours please call [phone].   05/13/23 0215           Final diagnoses:  Chest pain, unspecified type  Shortness of breath  Acute cough     Loleta Rose, MD 05/13/23 [phone]

## 2023-05-12 NOTE — ED Notes (Signed)
 Patient transported to CT

## 2023-05-12 NOTE — ED Triage Notes (Signed)
 Pt reports HTN and chest pain that began earlier tonight. Pt reports she was given 1 SL nitroglycerin by ems and took 324 asa PTA. Pt recently had hip surgery 5 weeks ago, pt is also reporting some shortness of breath.

## 2023-05-12 NOTE — ED Notes (Signed)
 Here initially for concerns of HTN, at home endorsed HA and blurry vision. States currently feels foggy, denies CP/SOB. Recent hip surgery, taking heparin and aspirin as prescribed. Pending provider eval.

## 2023-05-12 NOTE — ED Provider Notes (Signed)
 [facility] Provider Note    Event Date/Time   First MD Initiated Contact with Patient 05/12/23 2213     (approximate)   History   Chest Pain   HPI  Anshu Wehner is a 58 y.o. female with history of arthritis, anxiety, tobacco use, presenting with shortness of breath and chest pain.  Had a hip replacement done 5 weeks ago.  Noted that her blood pressure was high today.  Per EMS, she had chest pain, systolic blood pressures were in the 160s, was given sublingual nitro as well as 324 of aspirin.  States that pain improved slightly with the nitro.  Also has a cough.  Congestion.  No fevers.  No unilateral calf swelling or tenderness.  States no history of CHF or COPD.  Has not had a heart attack in the past.  On independent review she was seen at the end of January where she had a total hip arthroplasty.     Physical Exam   Triage Vital Signs: ED Triage Vitals  Encounter Vitals Group     BP 05/12/23 2101 125/83     Systolic BP Percentile --      Diastolic BP Percentile --      Pulse Rate 05/12/23 2101 78     Resp 05/12/23 2101 18     Temp 05/12/23 2101 97.8 F (36.6 C)     Temp Source 05/12/23 2101 Oral     SpO2 05/12/23 2056 98 %     Weight 05/12/23 2100 175 lb (79.4 kg)     Height 05/12/23 2100 5\' 10"  (1.778 m)     Head Circumference --      Peak Flow --      Pain Score 05/12/23 2100 4     Pain Loc --      Pain Education --      Exclude from Growth Chart --     Most recent vital signs: Vitals:   05/12/23 2219 05/12/23 2222  BP: 120/87   Pulse:    Resp: 11 16  Temp:    SpO2:       General: Awake, no distress.  CV:  Good peripheral perfusion.  Resp:  Normal effort.  Clear Abd:  No distention.  Soft nontender Other:  Trace lower extremity edema without unilateral calf swelling or tenderness.  Right hip surgical site is intact, no overlying erythema or drainage.   ED Results / Procedures / Treatments   Labs (all labs ordered are  listed, but only abnormal results are displayed) Labs Reviewed  BASIC METABOLIC PANEL - Abnormal; Notable for the following components:      Result Value   Glucose, Bld 160 (*)    All other components within normal limits  CBC - Abnormal; Notable for the following components:   WBC 10.9 (*)    All other components within normal limits  D-DIMER, QUANTITATIVE - Abnormal; Notable for the following components:   D-Dimer, Quant 0.89 (*)    All other components within normal limits  RESP PANEL BY RT-PCR (RSV, FLU A&B, COVID)  RVPGX2  BRAIN NATRIURETIC PEPTIDE  TROPONIN I (HIGH SENSITIVITY)  TROPONIN I (HIGH SENSITIVITY)     EKG  EKG shows sinus rhythm, rate of 78, normal QRS, normal QTc, T wave flattening in aVL, 1, T wave version to V2, no ischemic ST elevation, T wave inversions new compared to prior   RADIOLOGY Chest x-ray on my interpretation without focal consolidation   PROCEDURES:  Critical Care performed: No  Procedures   MEDICATIONS ORDERED IN ED: Medications  iohexol (OMNIPAQUE) 350 MG/ML injection 75 mL (has no administration in time range)     IMPRESSION / MDM / ASSESSMENT AND PLAN / ED COURSE  I reviewed the triage vital signs and the nursing notes.                              Differential diagnosis includes, but is not limited to, PE, ACS, COVID, influenza, RSV, pneumonia, CHF.  Will get labs, EKG, troponin, chest x-ray, D-dimer was obtained out of triage and it is elevated.  Get CT PE study.  Patient's presentation is most consistent with acute presentation with potential threat to life or bodily function.  Independent review of labs imaging are noted in the chart.  Patient signed out to overnight team pending CT PE study as well as rest of the labs.   Clinical Course as of 05/12/23 2300  Tue May 12, 2023  2259 Independent review of labs, electrolytes not severely deranged, creatinine is normal, mild leukocytosis, D-dimer is elevated, initial troponin  is negative. [TT]    Clinical Course User Index [TT] Jodie Echevaria, Franchot Erichsen, MD     FINAL CLINICAL IMPRESSION(S) / ED DIAGNOSES   Final diagnoses:  Chest pain, unspecified type  Shortness of breath  Acute cough     Rx / DC Orders   ED Discharge Orders     None        Note:  This document was prepared using Dragon voice recognition software and may include unintentional dictation errors.    Claybon Jabs, MD 05/12/23 2300

## 2023-05-13 NOTE — Discharge Instructions (Signed)

## 2023-05-21 ENCOUNTER — Ambulatory Visit: Admitting: Medical

## 2023-05-21 NOTE — Progress Notes (Deleted)
  Cardiology Office Note:  .   Date:  05/21/2023  ID:  Constance Goltz, DOB [birth year], MRN [id number] PCP: [facility]  [facility] Providers Cardiologist:  None { Click to update primary MD,subspecialty MD or APP then REFRESH:1}   History of Present Illness: Nichole Hall is a 58 y.o. female with a history of arthritis, anxiety, tobacco use who presents for ER follow-up.   She was seen in the ER 05/11/33 for chest pain. Vitals stable. Ddimer 0.89. EKG showed NSR TWI V1-V2. Hs trop negative x 2. Chest CTA showed no PE, pulmonary nodule. She was referred to cardiology.   Today,     ROS: ***  Studies Reviewed: .        *** Risk Assessment/Calculations:   {Does this patient have ATRIAL FIBRILLATION?:[phone]} No BP recorded.  {Refresh Note OR Click here to enter BP  :1}***       Physical Exam:   VS:  There were no vitals taken for this visit.   Wt Readings from Last 3 Encounters:  05/12/23 175 lb (79.4 kg)  04/06/23 194 lb (88 kg)  03/31/23 194 lb (88 kg)    GEN: Well nourished, well developed in no acute distress NECK: No JVD; No carotid bruits CARDIAC: ***RRR, no murmurs, rubs, gallops RESPIRATORY:  Clear to auscultation without rales, wheezing or rhonchi  ABDOMEN: Soft, non-tender, non-distended EXTREMITIES:  No edema; No deformity   ASSESSMENT AND PLAN: .   ***    {Are you ordering a CV Procedure (e.g. stress test, cath, DCCV, TEE, etc)?   Press F2        :[id number]}  Dispo: ***  Signed, Ezequias Lard David Stall, PA-C
